# Patient Record
Sex: Female | Born: 1985 | Race: Black or African American | Hispanic: No | Marital: Single | Smoking: Current every day smoker
Health system: Southern US, Community
[De-identification: ages and names within clinical notes are randomized; demographics above are authoritative.]

## PROBLEM LIST (undated history)

## (undated) ENCOUNTER — Emergency Department (HOSPITAL_COMMUNITY): Admission: EM | Payer: Self-pay | Source: Home / Self Care

## (undated) DIAGNOSIS — Z9889 Other specified postprocedural states: Secondary | ICD-10-CM

## (undated) DIAGNOSIS — R112 Nausea with vomiting, unspecified: Secondary | ICD-10-CM

## (undated) DIAGNOSIS — M549 Dorsalgia, unspecified: Secondary | ICD-10-CM

## (undated) HISTORY — PX: TUBAL LIGATION: SHX77

## (undated) HISTORY — PX: KNEE ARTHROSCOPY: SUR90

---
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
## 2006-08-19 ENCOUNTER — Emergency Department (HOSPITAL_COMMUNITY): Admission: EM | Admit: 2006-08-19 | Discharge: 2006-08-19 | Payer: Self-pay | Admitting: Emergency Medicine

## 2009-11-23 ENCOUNTER — Emergency Department (HOSPITAL_COMMUNITY): Admission: EM | Admit: 2009-11-23 | Discharge: 2009-11-23 | Payer: Self-pay | Admitting: Emergency Medicine

## 2010-03-16 ENCOUNTER — Emergency Department (HOSPITAL_COMMUNITY)
Admission: EM | Admit: 2010-03-16 | Discharge: 2010-03-16 | Payer: Self-pay | Source: Home / Self Care | Admitting: Emergency Medicine

## 2010-04-18 ENCOUNTER — Emergency Department (HOSPITAL_COMMUNITY)
Admission: EM | Admit: 2010-04-18 | Discharge: 2010-04-18 | Disposition: A | Discharge status: Home / Self Care | Payer: No Typology Code available for payment source | Attending: Emergency Medicine | Admitting: Emergency Medicine

## 2010-04-18 DIAGNOSIS — F1010 Alcohol abuse, uncomplicated: Secondary | ICD-10-CM | POA: Insufficient documentation

## 2010-04-18 LAB — CBC
MCH: 29 pg (ref 26.0–34.0)
Platelets: 283 10*3/uL (ref 150–400)
RBC: 4.38 MIL/uL (ref 3.87–5.11)

## 2010-04-18 LAB — RAPID URINE DRUG SCREEN, HOSP PERFORMED
Benzodiazepines: NOT DETECTED
Cocaine: POSITIVE — AB
Tetrahydrocannabinol: POSITIVE — AB

## 2010-04-18 LAB — ETHANOL: Alcohol, Ethyl (B): <5 mg/dL (ref 0–10)

## 2010-04-18 LAB — DIFFERENTIAL
Basophils Relative: 0 % (ref 0–1)
Eosinophils Absolute: 0.1 10*3/uL (ref 0.0–0.7)
Monocytes Relative: 8 % (ref 3–12)
Neutrophils Relative %: 51 % (ref 43–77)

## 2010-04-18 LAB — BASIC METABOLIC PANEL
Calcium: 9.5 mg/dL (ref 8.4–10.5)
Chloride: 101 mEq/L (ref 96–112)
Creatinine, Ser: 1.08 mg/dL (ref 0.4–1.2)
GFR calc Af Amer: >60 mL/min (ref >60)
GFR calc non Af Amer: >60 mL/min (ref >60)

## 2010-04-28 ENCOUNTER — Emergency Department (HOSPITAL_COMMUNITY)
Admission: EM | Admit: 2010-04-28 | Discharge: 2010-04-28 | Disposition: A | Discharge status: Home / Self Care | Payer: No Typology Code available for payment source | Attending: Emergency Medicine | Admitting: Emergency Medicine

## 2010-04-28 DIAGNOSIS — G43909 Migraine, unspecified, not intractable, without status migrainosus: Secondary | ICD-10-CM | POA: Insufficient documentation

## 2010-05-18 ENCOUNTER — Emergency Department (HOSPITAL_COMMUNITY)
Admission: EM | Admit: 2010-05-18 | Discharge: 2010-05-18 | Disposition: A | Discharge status: Home / Self Care | Payer: No Typology Code available for payment source | Attending: Emergency Medicine | Admitting: Emergency Medicine

## 2010-05-18 DIAGNOSIS — Z202 Contact with and (suspected) exposure to infections with a predominantly sexual mode of transmission: Secondary | ICD-10-CM | POA: Insufficient documentation

## 2010-05-18 LAB — POCT PREGNANCY, URINE: Preg Test, Ur: NEGATIVE

## 2010-07-10 NOTE — Op Note (Signed)
Susan Greene, Susan Greene                  ACCOUNT NO.:  [id number]   MEDICAL RECORD NO.:  [id number]          PATIENT TYPE:  AMB   LOCATION:  [facility]                           FACILITY:  [facility]   PHYSICIAN:  Vickki Hearing, M.D.DATE OF BIRTH:  [birth year]   DATE OF PROCEDURE:  11/15/2003  DATE OF DISCHARGE:                                 OPERATIVE REPORT   PREOPERATIVE DIAGNOSIS:  Anterior cruciate ligament tear of right knee.  Medial collateral ligament tear of right knee.  Lateral meniscal tear of  right knee.   POSTOPERATIVE DIAGNOSIS:  Anterior cruciate ligament and medial collateral  ligament tear of right knee.   FINDINGS:  Torn anterior cruciate ligament, medial collateral ligament had  healed.  No lateral meniscal tear was seen.   OPERATION PERFORMED:   SURGEON:  Vickki Hearing, M.D.   ASSISTANT:   ANESTHESIA:  General.   INDICATIONS FOR PROCEDURE:  The patient is a 25 year old female who tore her  ACL playing soccer.  She was going to have surgery in [place], but her  insurance was not taken by the physicians there and she presented here about  six months post injury complaining of giving way of the knee on pivoting  maneuver.  She wished to return to an active soccer career.   DESCRIPTION OF PROCEDURE:  She was identified in preop holding.  She was  given Ancef.  Her knee was marked as the surgical site on the right side  over her designation of the surgical site.  I checked her chart and consent  and confirmed this.  We took her to the operating room.  She had general  anesthesia.  She had a prep and drape of her right knee after examination  under anesthesia.  We then took the mandatory time out and everyone agreed  on the procedure, extremity and the patient's identity.   The examination under anesthesia revealed that her Lachman and pivot shift  tests were grade 2.  Her MCL was normal.  We started by taking the graft  with no tourniquet used.  We made an  anterior incision over the patellar  tendon, extended it down to the paratenon, divided it and exposed the  tendon.  We took the middle third of a 29 mm tendon and took a 20 mm bone  block distally and 50% of the length of the patella proximally.  We put the  graft on the back table under tension and then performed diagnostic  arthroscopy of the knee.   All structures were probed and visualized and the findings area as stated.  The ACL was torn.  It was a complete tear.  We did a notchplasty, removed  the ACL stump, passed the tibial guide set at 50 degrees.  We put the ACL  tunnel 7 mm anterior to the PCL.  We overreamed the guide pin with an 11 mm  reamer, cleaned the tibial tunnel and rasped and cleaned its edges.   We put the over-the-top guide over the femoral over-the-top position with a  7 mm offset, drilled a  guide pin through it and then passed a 10 mm reamer  25 mm.  We gave an additional 10 mm for the endo button flipping.  We passed  the endo button drill bit over the pin, measured it to a 52, took a 35 mm  endo button attached to the femoral graft and passed the graft, secured the  endo button by flipping it.  We ensured distal fixation by pulling on the  distal sutures.  We cycled the knee 25 repetitions, noted a nice recession  of the graft on extension. We fixed the graft in extension with a 35 x 11 mm  Bioscrew. We checked the graft for flexion and for Lachman test.  That was  satisfactory.  We re-viewed the graft visually, arthroscopically, checked  tension and position.  That was good.  We irrigated the knee, closed the  patellar tendon defect with #1 interrupted Vicryl sutures.  We bone grafted  the patellar defect, closed with 2-0 Vicryl and 3-0 Biosyn, passed the pain  pump catheter, wrapped the knee sterilely, placed a cryo cuff, extubated her  and took her to recovery room in stable condition.   She will have a regular ACL protocol and follow up with me on  Monday.      SEH/MEDQ  D:  11/15/2003  T:  11/16/2003  Job:  [id number]

## 2010-07-10 NOTE — H&P (Signed)
NAME:  Susan Greene, Susan Greene NO.:  [id number]   MEDICAL RECORD NO.:  [id number]                   PATIENT TYPE:   LOCATION:                                       FACILITY:   PHYSICIAN:  Vickki Hearing, M.D.           DATE OF BIRTH:  [birth year]   DATE OF ADMISSION:  DATE OF DISCHARGE:                                HISTORY & PHYSICAL   CHIEF COMPLAINT:  Right knee anterior cruciate ligament tear.   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  She saw  Dr. Brynda Peon in [place] who recommended surgery after MRI showed that she had  an ACL, MCL, and lateral meniscal tear.  The patient did not have surgery at  that time, went back to playing soccer and wore a brace.  However, in the  brace the patient's knee gives out and goes out place when she cuts or  pivots.  After my evaluation confirmed her ACL tear, she presents for ACL  reconstruction with a bone patella tendon bone autograft.   ALLERGIES:  No known drug allergies.   PAST MEDICAL HISTORY:  No medical problems.   PAST SURGICAL HISTORY:  No previous surgery.   FAMILY HISTORY:  Asthma.   PRIMARY CARE PHYSICIAN:  Dr. Sudie Bailey.   SOCIAL HISTORY:  She does not smoke or drink.   REVIEW OF SYSTEMS:  No other complaints.   PHYSICAL EXAMINATION:  EXTREMITIES:  She has normal pulse and perfusion.  Extremities were warm, no edema, no tenderness.  Lymph nodes were negative.  She had a normal gait pattern.  Had some lateral joint line tenderness, a  positive Lachman's test, a positive pivot-shift test.  She had full range of  motion.  Quadriceps mechanism was intact.  Extensor mechanism was intact.  No chondromalacia of the patella was noted.  SKIN:  No scar, rash, lesions, ulcers.  NEUROLOGIC:  Sensation was normal, reflexes were normal.  She was awake and  alert.  She was not depressed, anxious, or agitated.  GENERAL:  She was thin, normal development.   LABORATORY DATA:  Radiographs show the growth plates to be closed.  MRI  shows she has complete tear of her ACL, a medial collateral ligament sprain  which should be healed at this point, she has a lateral meniscal tear.   DIAGNOSES:  1.  Anterior cruciate ligament tear, right knee.  2.  Lateral meniscal tear.  3.  Medial cruciate ligament tear, right knee.   PLAN:  Anterior cruciate ligament autograft with bone patella tendon bone  construct.  Lateral meniscectomy if needed.  Straight forward anterior  cruciate ligament protocol.     ___________________________________________  Vickki Hearing, M.D.   SEH/MEDQ  D:  11/05/2003  T:  11/05/2003  Job:  [id number]

## 2010-09-27 ENCOUNTER — Emergency Department (HOSPITAL_COMMUNITY)
Admission: EM | Admit: 2010-09-27 | Discharge: 2010-09-27 | Disposition: A | Discharge status: Home / Self Care | Payer: Self-pay | Attending: Emergency Medicine | Admitting: Emergency Medicine

## 2010-09-27 ENCOUNTER — Encounter: Payer: Self-pay | Admitting: *Deleted

## 2010-09-27 DIAGNOSIS — M62838 Other muscle spasm: Secondary | ICD-10-CM | POA: Insufficient documentation

## 2010-09-27 MED ORDER — KETOROLAC TROMETHAMINE 60 MG/2ML IM SOLN
60.0000 mg | Freq: Once | INTRAMUSCULAR | Status: AC
  Administered 2010-09-27: 60 mg via INTRAMUSCULAR
  Filled 2010-09-27: qty 2

## 2010-09-27 MED ORDER — IBUPROFEN 800 MG PO TABS: 800.0000 mg | ORAL_TABLET | Freq: Three times a day (TID) | ORAL | Status: AC

## 2010-09-27 MED ORDER — CYCLOBENZAPRINE HCL 10 MG PO TABS: 10.0000 mg | ORAL_TABLET | Freq: Two times a day (BID) | ORAL | Status: AC | PRN

## 2010-09-27 NOTE — ED Provider Notes (Signed)
History     CSN: [id number] Arrival date & time: 09/27/2010  8:45 PM  Chief Complaint  Patient presents with  . Back Pain  . Arm Swelling   HPI Comments: Patient presents with left upper back pain. has some swelling in her arm.  This has been going on for several months, is constant, it is worse with stretching her arm with palpation of her back. There is no associated numbness or tingling or weakness of the left upper extremity. She does not note any specific injuries. She has had no medications prior to arrival.  Patient is a 25 y.o. female presenting with back pain. The history is provided by the patient and a relative.  Back Pain  Pertinent negatives include no numbness and no weakness.    History reviewed. No pertinent past medical history.  Past Surgical History  Procedure Date  . Knee arthroscopy Right knee    No family history on file.  History  Substance Use Topics  . Smoking status: Not on file  . Smokeless tobacco: Not on file  . Alcohol Use: 14.4 oz/week    24 Cans of beer per week    OB History    Grav Para Term Preterm Abortions TAB SAB Ect Mult Living                  Review of Systems  Musculoskeletal: Positive for back pain.  Skin: Negative for rash.  Neurological: Negative for weakness and numbness.    Physical Exam  BP 124/66  Pulse 84  Temp(Src) 98 F (36.7 C) (Oral)  Resp 20  Ht 5\' 5"  (1.651 m)  Wt 130 lb (58.968 kg)  BMI 21.63 kg/m2  SpO2 100%  Physical Exam  Constitutional: She appears well-developed and well-nourished. No distress.  HENT:  Head: Normocephalic and atraumatic.  Eyes: Conjunctivae are normal. No scleral icterus.  Neck: Normal range of motion. Neck supple.  Cardiovascular: Normal rate, regular rhythm and normal heart sounds.   No murmur heard. Pulmonary/Chest: Effort normal and breath sounds normal.  Musculoskeletal: Normal range of motion. She exhibits tenderness. She exhibits no edema.       Tenderness to  palpation in the left rhomboid, left trapezius muscle. Normal exam of the left upper extremity with normal range of motion  Neurological: She is alert. Coordination normal.  Skin: Skin is warm and dry. No rash noted. She is not diaphoretic.    ED Course  Procedures  MDM Patient has no apparent distress, muscle spasm in the left rhomboid. Left trapezius. Will address this with intramuscular anti-inflammatories and stretching exercises. We have gone over this in the room and she has expressed her understanding and need for followup.      Vida Roller, MD 09/27/10 2114

## 2010-09-27 NOTE — ED Notes (Signed)
Patient discharged home in good condition.  Prescriptions given for Flexeril and Ibuprofen; effects and use explained.  Patient verbalized understanding of effects of medication and to f/u with PMD.

## 2010-09-27 NOTE — ED Notes (Signed)
Pt st's that her lower back and left arm are swelling.  St's that the pain from her back is radiating up to her left arm.  No visible swelling when compared to right arm.

## 2010-09-27 NOTE — Discharge Instructions (Signed)
Ibuprofen 3 times a [place], Flexeril as needed. Stretch her arm in your back constantly throughout the [place] to help improve range of motion and help reduce the pain.

## 2012-02-24 ENCOUNTER — Observation Stay (HOSPITAL_COMMUNITY)
Admission: EM | Admit: 2012-02-24 | Discharge: 2012-02-27 | Disposition: A | Discharge status: Home / Self Care | Payer: Medicaid - Out of State | Attending: General Surgery | Admitting: General Surgery

## 2012-02-24 ENCOUNTER — Encounter (HOSPITAL_COMMUNITY): Payer: Self-pay | Admitting: *Deleted

## 2012-02-24 DIAGNOSIS — K612 Anorectal abscess: Principal | ICD-10-CM | POA: Insufficient documentation

## 2012-02-24 DIAGNOSIS — Z01812 Encounter for preprocedural laboratory examination: Secondary | ICD-10-CM | POA: Insufficient documentation

## 2012-02-24 DIAGNOSIS — K603 Anal fistula, unspecified: Secondary | ICD-10-CM | POA: Insufficient documentation

## 2012-02-24 DIAGNOSIS — K611 Rectal abscess: Secondary | ICD-10-CM

## 2012-02-24 HISTORY — DX: Other specified postprocedural states: R11.2

## 2012-02-24 HISTORY — DX: Other specified postprocedural states: Z98.890

## 2012-02-24 HISTORY — DX: Nausea with vomiting, unspecified: Z98.890

## 2012-02-24 LAB — CBC WITH DIFFERENTIAL/PLATELET
Basophils Absolute: 0 10*3/uL (ref 0.0–0.1)
Basophils Relative: 0 % (ref 0–1)
Hemoglobin: 11.5 g/dL — ABNORMAL LOW (ref 12.0–15.0)
MCHC: 34.7 g/dL (ref 30.0–36.0)
Neutro Abs: 16.5 10*3/uL — ABNORMAL HIGH (ref 1.7–7.7)
Neutrophils Relative %: 87 % — ABNORMAL HIGH (ref 43–77)
Platelets: 340 10*3/uL (ref 150–400)
RDW: 14.8 % (ref 11.5–15.5)

## 2012-02-24 LAB — BASIC METABOLIC PANEL
Calcium: 8.8 mg/dL (ref 8.4–10.5)
Chloride: 93 mEq/L — ABNORMAL LOW (ref 96–112)
GFR calc Af Amer: >90 mL/min (ref >90)
GFR calc non Af Amer: >90 mL/min (ref >90)
GFR calc non Af Amer: >90 mL/min (ref >90)
Potassium: 2.7 mEq/L — CL (ref 3.5–5.1)
Potassium: 3.2 mEq/L — ABNORMAL LOW (ref 3.5–5.1)
Sodium: 134 mEq/L — ABNORMAL LOW (ref 135–145)
Sodium: 133 mEq/L — ABNORMAL LOW (ref 135–145)

## 2012-02-24 LAB — HCG, SERUM, QUALITATIVE: Preg, Serum: NEGATIVE

## 2012-02-24 MED ORDER — HYDROMORPHONE HCL PF 1 MG/ML IJ SOLN
1.0000 mg | INTRAMUSCULAR | Status: DC | PRN
  Administered 2012-02-24 – 2012-02-25 (×5): 1 mg via INTRAVENOUS
  Filled 2012-02-24 (×5): qty 1

## 2012-02-24 MED ORDER — METRONIDAZOLE IN NACL 5-0.79 MG/ML-% IV SOLN
500.0000 mg | Freq: Three times a day (TID) | INTRAVENOUS | Status: DC
  Administered 2012-02-24 – 2012-02-25 (×2): 500 mg via INTRAVENOUS
  Filled 2012-02-24 (×12): qty 100

## 2012-02-24 MED ORDER — POTASSIUM CHLORIDE 10 MEQ/100ML IV SOLN
10.0000 meq | INTRAVENOUS | Status: AC
  Administered 2012-02-24 (×5): 10 meq via INTRAVENOUS
  Filled 2012-02-24: qty 100
  Filled 2012-02-24: qty 400

## 2012-02-24 MED ORDER — CHLORHEXIDINE GLUCONATE 4 % EX LIQD: 1.0000 "application " | Freq: Once | CUTANEOUS | Status: DC

## 2012-02-24 MED ORDER — ACETAMINOPHEN 325 MG PO TABS: 650.0000 mg | ORAL_TABLET | Freq: Four times a day (QID) | ORAL | Status: DC | PRN

## 2012-02-24 MED ORDER — CIPROFLOXACIN IN D5W 400 MG/200ML IV SOLN
400.0000 mg | Freq: Two times a day (BID) | INTRAVENOUS | Status: DC
  Administered 2012-02-24 – 2012-02-25 (×2): 400 mg via INTRAVENOUS
  Filled 2012-02-24 (×8): qty 200

## 2012-02-24 MED ORDER — DIPHENHYDRAMINE HCL 12.5 MG/5ML PO ELIX
12.5000 mg | ORAL_SOLUTION | Freq: Four times a day (QID) | ORAL | Status: DC | PRN
  Administered 2012-02-25: 12.5 mg via ORAL
  Filled 2012-02-24: qty 5

## 2012-02-24 MED ORDER — DIPHENHYDRAMINE HCL 50 MG/ML IJ SOLN: 12.5000 mg | Freq: Four times a day (QID) | INTRAMUSCULAR | Status: DC | PRN

## 2012-02-24 MED ORDER — ACETAMINOPHEN 650 MG RE SUPP: 650.0000 mg | Freq: Four times a day (QID) | RECTAL | Status: DC | PRN

## 2012-02-24 MED ORDER — SODIUM CHLORIDE 0.9 % IV BOLUS (SEPSIS)
1000.0000 mL | Freq: Once | INTRAVENOUS | Status: AC
  Administered 2012-02-24: 1000 mL via INTRAVENOUS

## 2012-02-24 MED ORDER — KCL IN DEXTROSE-NACL 40-5-0.45 MEQ/L-%-% IV SOLN
INTRAVENOUS | Status: DC
  Administered 2012-02-24: 23:00:00 via INTRAVENOUS

## 2012-02-24 MED ORDER — HYDROCODONE-ACETAMINOPHEN 5-325 MG PO TABS
1.0000 | ORAL_TABLET | ORAL | Status: DC | PRN
  Administered 2012-02-24 (×2): 1 via ORAL
  Filled 2012-02-24 (×2): qty 1

## 2012-02-24 MED ORDER — ONDANSETRON HCL 4 MG/2ML IJ SOLN
4.0000 mg | Freq: Four times a day (QID) | INTRAMUSCULAR | Status: DC | PRN
  Administered 2012-02-24: 4 mg via INTRAVENOUS
  Filled 2012-02-24: qty 2

## 2012-02-24 MED ORDER — ENOXAPARIN SODIUM 40 MG/0.4ML ~~LOC~~ SOLN
40.0000 mg | SUBCUTANEOUS | Status: DC
  Filled 2012-02-24: qty 0.4

## 2012-02-24 NOTE — ED Provider Notes (Signed)
History    This chart was scribed for Benny Lennert, MD, MD by Smitty Pluck, ED Scribe. The patient was seen in room [facility]07 and the patient's care was started at 2:13 PM.   CSN: [id number]  Arrival date & time 02/24/12  1244   None     Chief Complaint  Patient presents with  . Rectal Pain    (Consider location/radiation/quality/duration/timing/severity/associated sxs/prior treatment) The history is provided by the patient. No language interpreter was used.   Susan Greene is a 27 y.o. female who presents to the Emergency Department complaining of constant, moderate rectal pain. Pt reports that she was given cream at [facility] but states she has not had relief of pain. She reports swelling and inflammation of her rectum. She denies any other pain or symptoms.  History reviewed. No pertinent past medical history.  Past Surgical History  Procedure Date  . Knee arthroscopy Right knee    History reviewed. No pertinent family history.  History  Substance Use Topics  . Smoking status: Current Every Antonopoulos Smoker -- 0.2 packs/[place]    Types: Cigarettes  . Smokeless tobacco: Not on file  . Alcohol Use: 14.4 oz/week    24 Cans of beer per week    OB History    Grav Para Term Preterm Abortions TAB SAB Ect Mult Living                  Review of Systems  Constitutional: Negative for fatigue.  HENT: Negative for congestion, sinus pressure and ear discharge.   Eyes: Negative for discharge.  Respiratory: Negative for cough.   Cardiovascular: Negative for chest pain.  Gastrointestinal: Positive for rectal pain. Negative for abdominal pain and diarrhea.  Genitourinary: Negative for frequency and hematuria.  Musculoskeletal: Negative for back pain.  Skin: Negative for rash.  Neurological: Negative for seizures and headaches.  Hematological: Negative.   Psychiatric/Behavioral: Negative for hallucinations.  All other systems reviewed and are negative.    Allergies  Review  of patient's allergies indicates no known allergies.  Home Medications   Current Outpatient Rx  Name  Route  Sig  Dispense  Refill  . ACETAMINOPHEN 500 MG PO TABS   Oral   Take 1,000 mg by mouth every 6 (six) hours as needed. pain          . IBUPROFEN 200 MG PO TABS   Oral   Take 800 mg by mouth every 6 (six) hours as needed. pain            BP 142/88  Pulse 147  Temp 98.5 F (36.9 C) (Oral)  Resp 20  Ht 5\' 5"  (1.651 m)  Wt 130 lb (58.968 kg)  BMI 21.63 kg/m2  LMP 01/24/2012  Physical Exam  Nursing note and vitals reviewed. Constitutional: She is oriented to person, place, and time. She appears well-developed.  HENT:  Head: Normocephalic and atraumatic.  Eyes: Conjunctivae normal and EOM are normal. No scleral icterus.  Neck: Neck supple. No thyromegaly present.  Cardiovascular: Normal rate and regular rhythm.  Exam reveals no gallop and no friction rub.   No murmur heard. Pulmonary/Chest: No stridor. She has no wheezes. She has no rales. She exhibits no tenderness.  Abdominal: She exhibits no distension. There is no tenderness. There is no rebound.  Genitourinary:       Perirectal abscess superior to anus 3 cm in diameter  Musculoskeletal: Normal range of motion. She exhibits no edema.  Lymphadenopathy:    She  has no cervical adenopathy.  Neurological: She is oriented to person, place, and time. Coordination normal.  Skin: No rash noted. No erythema.  Psychiatric: She has a normal mood and affect. Her behavior is normal.    ED Course  Procedures (including critical care time)   COORDINATION OF CARE: 2:18 PM Discussed ED treatment with pt  2:44 PM Consult: Dr. Lovell Sheehan (surgeon) concerning pt care.    Labs Reviewed - No data to display No results found.   No diagnosis found.    MDM   The chart was scribed for me under my direct supervision.  I personally performed the history, physical, and medical decision making and all procedures in the  evaluation of this patient.Benny Lennert, MD 02/26/12 [phone]

## 2012-02-24 NOTE — ED Notes (Signed)
Rectal pain, seen at [facility] and given a creme to use, says no better.  Decreased appetite, but says she has been drinking flds.

## 2012-02-24 NOTE — ED Notes (Signed)
Dr. Lovell Sheehan here to assess for Admission

## 2012-02-24 NOTE — H&P (Signed)
Susan Greene is an 27 y.o. female.   Chief Complaint: Rectal pain HPI: Patient is a 27 year old black female who went to the emergency room in [place] 2 days ago for perirectal pain. She was discharged home on hemorrhoidal cream. She presented back to the emergency room here at [facility] for further evaluation treatment. She states her rectal pain has worsened. She feels nauseated and has had decreased appetite for the past 3-4 days. Evaluation in the emergency room revealed a large perirectal abscess.  History reviewed. No pertinent past medical history.  Past Surgical History  Procedure Date  . Knee arthroscopy Right knee    History reviewed. No pertinent family history. Social History:  reports that she has been smoking Cigarettes.  She has been smoking about .25 packs per [place]. She does not have any smokeless tobacco history on file. She reports that she drinks about 14.4 ounces of alcohol per week. She reports that she does not use illicit drugs.  Allergies: No Known Allergies   (Not in a hospital admission)  Results for orders placed during the hospital encounter of 02/24/12 (from the past 48 hour(s))  CBC WITH DIFFERENTIAL     Status: Abnormal   Collection Time   02/24/12  2:19 PM      Component Value Range Comment   WBC 18.9 (*) 4.0 - 10.5 K/uL    RBC 4.01  3.87 - 5.11 MIL/uL    Hemoglobin 11.5 (*) 12.0 - 15.0 g/dL    HCT 16.1 (*) 09.6 - 46.0 %    MCV 82.5  78.0 - 100.0 fL    MCH 28.7  26.0 - 34.0 pg    MCHC 34.7  30.0 - 36.0 g/dL    RDW 04.5  40.9 - 81.1 %    Platelets 340  150 - 400 K/uL    Neutrophils Relative 87 (*) 43 - 77 %    Neutro Abs 16.5 (*) 1.7 - 7.7 K/uL    Lymphocytes Relative 5 (*) 12 - 46 %    Lymphs Abs 0.8  0.7 - 4.0 K/uL    Monocytes Relative 8  3 - 12 %    Monocytes Absolute 1.5 (*) 0.1 - 1.0 K/uL    Eosinophils Relative 0  0 - 5 %    Eosinophils Absolute 0.0  0.0 - 0.7 K/uL    Basophils Relative 0  0 - 1 %    Basophils Absolute 0.0  0.0 - 0.1 K/uL     No results found.  Review of Systems  Constitutional: Positive for fever and malaise/fatigue.  HENT: Negative.   Eyes: Negative.   Respiratory: Negative.   Cardiovascular: Negative.   Gastrointestinal: Positive for nausea.  Genitourinary: Negative.   Musculoskeletal: Negative.   Skin: Negative.   Neurological: Negative.   Endo/Heme/Allergies: Negative.   Psychiatric/Behavioral: Negative.     Blood pressure 116/51, pulse 103, temperature 98.5 F (36.9 C), temperature source Oral, resp. rate 20, height 5\' 5"  (1.651 m), weight 58.968 kg (130 lb), last menstrual period 01/24/2012, SpO2 100.00%. Physical Exam  Constitutional: She is oriented to person, place, and time. She appears well-developed and well-nourished.  HENT:  Head: Normocephalic and atraumatic.  Neck: Normal range of motion. Neck supple.  Cardiovascular: Normal rate, regular rhythm and normal heart sounds.   Respiratory: Effort normal and breath sounds normal.  GI: Soft. Bowel sounds are normal.  Genitourinary:       Large posterior perirectal abscess present.  No drainage noted.  Neurological: She is alert and oriented to person, place, and time.  Skin: Skin is warm and dry.  Psychiatric: She has a normal mood and affect. Her behavior is normal. Thought content normal.     Assessment/Plan Impression: Perirectal abscess Plan: The patient be admitted to the hospital for IV antibiotics. She was subsequently undergo incision and drainage of the perirectal abscess tomorrow. The risks and benefits of the procedure were fully explained to the patient, who gave informed consent.  [name] A 02/24/2012, 3:09 PM

## 2012-02-24 NOTE — ED Notes (Signed)
CRITICAL VALUE ALERT  Critical value received:  K+ 2.7  Date of notification:  02/24/12  Time of notification:  1527  Critical value read back: yes  Nurse who received alert:  A. Dareen Piano, RN  MD notified (1st page):  Dr. Lovell Sheehan  Time of first page:  [phone]

## 2012-02-25 ENCOUNTER — Observation Stay (HOSPITAL_COMMUNITY): Payer: Medicaid - Out of State | Admitting: Anesthesiology

## 2012-02-25 ENCOUNTER — Encounter (HOSPITAL_COMMUNITY): Payer: Self-pay | Admitting: Anesthesiology

## 2012-02-25 ENCOUNTER — Encounter (HOSPITAL_COMMUNITY): Payer: Self-pay | Admitting: *Deleted

## 2012-02-25 ENCOUNTER — Encounter (HOSPITAL_COMMUNITY)
Admission: EM | Disposition: A | Discharge status: Home / Self Care | Payer: Self-pay | Source: Home / Self Care | Attending: Emergency Medicine

## 2012-02-25 HISTORY — PX: INCISION AND DRAINAGE PERIRECTAL ABSCESS: SHX1804

## 2012-02-25 LAB — MRSA PCR SCREENING: MRSA by PCR: NEGATIVE

## 2012-02-25 SURGERY — INCISION AND DRAINAGE, ABSCESS, PERIRECTAL
Anesthesia: General | Site: Rectum | Wound class: Dirty or Infected

## 2012-02-25 MED ORDER — CIPROFLOXACIN IN D5W 400 MG/200ML IV SOLN
INTRAVENOUS | Status: DC | PRN
  Administered 2012-02-25: 400 mg via INTRAVENOUS

## 2012-02-25 MED ORDER — ONDANSETRON HCL 4 MG/2ML IJ SOLN: 4.0000 mg | Freq: Four times a day (QID) | INTRAMUSCULAR | Status: DC | PRN

## 2012-02-25 MED ORDER — FENTANYL CITRATE 0.05 MG/ML IJ SOLN
INTRAMUSCULAR | Status: DC | PRN
  Administered 2012-02-25 (×2): 25 ug via INTRAVENOUS
  Administered 2012-02-25: 100 ug via INTRAVENOUS
  Administered 2012-02-25 (×2): 25 ug via INTRAVENOUS

## 2012-02-25 MED ORDER — FENTANYL CITRATE 0.05 MG/ML IJ SOLN
INTRAMUSCULAR | Status: AC
  Filled 2012-02-25: qty 2

## 2012-02-25 MED ORDER — ACETAMINOPHEN 10 MG/ML IV SOLN
1000.0000 mg | Freq: Four times a day (QID) | INTRAVENOUS | Status: DC
  Administered 2012-02-25: 1000 mg via INTRAVENOUS
  Filled 2012-02-25 (×5): qty 100

## 2012-02-25 MED ORDER — PROPOFOL 10 MG/ML IV EMUL
INTRAVENOUS | Status: DC | PRN
  Administered 2012-02-25: 50 mg via INTRAVENOUS
  Administered 2012-02-25: 200 mg via INTRAVENOUS

## 2012-02-25 MED ORDER — GLYCOPYRROLATE 0.2 MG/ML IJ SOLN
0.2000 mg | Freq: Once | INTRAMUSCULAR | Status: AC
  Administered 2012-02-25: 0.2 mg via INTRAVENOUS

## 2012-02-25 MED ORDER — MIDAZOLAM HCL 2 MG/2ML IJ SOLN
1.0000 mg | INTRAMUSCULAR | Status: DC | PRN
  Administered 2012-02-25: 2 mg via INTRAVENOUS

## 2012-02-25 MED ORDER — CHLORHEXIDINE GLUCONATE 4 % EX LIQD
1.0000 "application " | Freq: Once | CUTANEOUS | Status: AC
  Administered 2012-02-25: 1 via TOPICAL
  Filled 2012-02-25: qty 15

## 2012-02-25 MED ORDER — BUPIVACAINE HCL (PF) 0.5 % IJ SOLN
INTRAMUSCULAR | Status: AC
  Filled 2012-02-25: qty 30

## 2012-02-25 MED ORDER — DIPHENHYDRAMINE HCL 12.5 MG/5ML PO ELIX: 12.5000 mg | ORAL_SOLUTION | Freq: Four times a day (QID) | ORAL | Status: DC | PRN

## 2012-02-25 MED ORDER — SODIUM CHLORIDE 0.9 % IJ SOLN
INTRAMUSCULAR | Status: AC
  Administered 2012-02-25: 3 mL
  Filled 2012-02-25: qty 3

## 2012-02-25 MED ORDER — LACTATED RINGERS IV SOLN
INTRAVENOUS | Status: DC
  Administered 2012-02-25 – 2012-02-26 (×2): via INTRAVENOUS

## 2012-02-25 MED ORDER — GLYCOPYRROLATE 0.2 MG/ML IJ SOLN
INTRAMUSCULAR | Status: AC
  Filled 2012-02-25: qty 1

## 2012-02-25 MED ORDER — LIDOCAINE HCL (CARDIAC) 10 MG/ML IV SOLN
INTRAVENOUS | Status: DC | PRN
  Administered 2012-02-25: 50 mg via INTRAVENOUS

## 2012-02-25 MED ORDER — SODIUM CHLORIDE 0.9 % IJ SOLN
INTRAMUSCULAR | Status: AC
  Administered 2012-02-25: 12:00:00
  Filled 2012-02-25: qty 3

## 2012-02-25 MED ORDER — ONDANSETRON HCL 4 MG/2ML IJ SOLN
INTRAMUSCULAR | Status: AC
  Filled 2012-02-25: qty 2

## 2012-02-25 MED ORDER — ENOXAPARIN SODIUM 40 MG/0.4ML ~~LOC~~ SOLN
40.0000 mg | SUBCUTANEOUS | Status: DC
  Administered 2012-02-26: 40 mg via SUBCUTANEOUS
  Filled 2012-02-25: qty 0.4

## 2012-02-25 MED ORDER — DIPHENHYDRAMINE HCL 12.5 MG/5ML PO ELIX
12.5000 mg | ORAL_SOLUTION | Freq: Four times a day (QID) | ORAL | Status: DC | PRN
  Administered 2012-02-25: 12.5 mg via ORAL
  Filled 2012-02-25: qty 5

## 2012-02-25 MED ORDER — PROPOFOL 10 MG/ML IV EMUL
INTRAVENOUS | Status: AC
  Filled 2012-02-25: qty 20

## 2012-02-25 MED ORDER — ARTIFICIAL TEARS OP OINT
TOPICAL_OINTMENT | OPHTHALMIC | Status: AC
  Filled 2012-02-25: qty 3.5

## 2012-02-25 MED ORDER — HYDROCODONE-ACETAMINOPHEN 5-325 MG PO TABS
1.0000 | ORAL_TABLET | ORAL | Status: DC | PRN
  Administered 2012-02-25: 1 via ORAL
  Administered 2012-02-25 – 2012-02-27 (×7): 2 via ORAL
  Filled 2012-02-25 (×6): qty 2
  Filled 2012-02-25: qty 1
  Filled 2012-02-25: qty 2

## 2012-02-25 MED ORDER — HYDROMORPHONE HCL PF 1 MG/ML IJ SOLN
1.0000 mg | INTRAMUSCULAR | Status: DC | PRN
  Administered 2012-02-25 (×3): 1 mg via INTRAVENOUS
  Filled 2012-02-25 (×3): qty 1

## 2012-02-25 MED ORDER — LACTATED RINGERS IV SOLN
INTRAVENOUS | Status: DC | PRN
  Administered 2012-02-25 (×2): via INTRAVENOUS

## 2012-02-25 MED ORDER — BUPIVACAINE HCL (PF) 0.5 % IJ SOLN
INTRAMUSCULAR | Status: DC | PRN
  Administered 2012-02-25: 5 mL

## 2012-02-25 MED ORDER — CIPROFLOXACIN IN D5W 400 MG/200ML IV SOLN
400.0000 mg | Freq: Two times a day (BID) | INTRAVENOUS | Status: DC
  Administered 2012-02-25 – 2012-02-26 (×3): 400 mg via INTRAVENOUS
  Filled 2012-02-25 (×6): qty 200

## 2012-02-25 MED ORDER — METRONIDAZOLE IN NACL 5-0.79 MG/ML-% IV SOLN
500.0000 mg | Freq: Three times a day (TID) | INTRAVENOUS | Status: DC
  Administered 2012-02-25 – 2012-02-27 (×6): 500 mg via INTRAVENOUS
  Filled 2012-02-25 (×10): qty 100

## 2012-02-25 MED ORDER — ONDANSETRON HCL 4 MG PO TABS: 4.0000 mg | ORAL_TABLET | Freq: Four times a day (QID) | ORAL | Status: DC | PRN

## 2012-02-25 MED ORDER — SODIUM CHLORIDE 0.9 % IR SOLN
Status: DC | PRN
  Administered 2012-02-25: 1000 mL

## 2012-02-25 MED ORDER — LACTATED RINGERS IV SOLN: INTRAVENOUS | Status: DC

## 2012-02-25 MED ORDER — LIDOCAINE HCL (PF) 1 % IJ SOLN
INTRAMUSCULAR | Status: AC
  Filled 2012-02-25: qty 5

## 2012-02-25 MED ORDER — MIDAZOLAM HCL 2 MG/2ML IJ SOLN
INTRAMUSCULAR | Status: AC
  Filled 2012-02-25: qty 2

## 2012-02-25 MED ORDER — ONDANSETRON HCL 4 MG/2ML IJ SOLN
4.0000 mg | Freq: Once | INTRAMUSCULAR | Status: AC
  Administered 2012-02-25: 4 mg via INTRAVENOUS

## 2012-02-25 SURGICAL SUPPLY — 29 items
BAG HAMPER (MISCELLANEOUS) ×3 IMPLANT
BANDAGE CONFORM 2  STR LF (GAUZE/BANDAGES/DRESSINGS) IMPLANT
CLOTH BEACON ORANGE TIMEOUT ST (SAFETY) ×3 IMPLANT
COVER LIGHT HANDLE STERIS (MISCELLANEOUS) ×6 IMPLANT
ELECT REM PT RETURN 9FT ADLT (ELECTROSURGICAL) ×3
ELECTRODE REM PT RTRN 9FT ADLT (ELECTROSURGICAL) ×2 IMPLANT
GAUZE PACKING IODOFORM 1 (PACKING) ×3 IMPLANT
GLOVE BIOGEL M STRL SZ7.5 (GLOVE) ×3 IMPLANT
GLOVE EXAM NITRILE MD LF STRL (GLOVE) ×6 IMPLANT
GLOVE SS BIOGEL STRL SZ 6.5 (GLOVE) ×2 IMPLANT
GLOVE SUPERSENSE BIOGEL SZ 6.5 (GLOVE) ×1
GOWN STRL REIN XL XLG (GOWN DISPOSABLE) ×6 IMPLANT
KIT ROOM TURNOVER APOR (KITS) ×3 IMPLANT
MANIFOLD NEPTUNE II (INSTRUMENTS) ×3 IMPLANT
MARKER SKIN DUAL TIP RULER LAB (MISCELLANEOUS) ×3 IMPLANT
NEEDLE HYPO 25X1 1.5 SAFETY (NEEDLE) ×3 IMPLANT
NS IRRIG 1000ML POUR BTL (IV SOLUTION) ×3 IMPLANT
PACK BASIC LIMB (CUSTOM PROCEDURE TRAY) IMPLANT
PACK MINOR (CUSTOM PROCEDURE TRAY) IMPLANT
PACK PERI GYN (CUSTOM PROCEDURE TRAY) ×3 IMPLANT
PAD ABD 5X9 TENDERSORB (GAUZE/BANDAGES/DRESSINGS) IMPLANT
PAD ARMBOARD 7.5X6 YLW CONV (MISCELLANEOUS) ×3 IMPLANT
SET BASIN LINEN APH (SET/KITS/TRAYS/PACK) ×3 IMPLANT
SPONGE GAUZE 4X4 12PLY (GAUZE/BANDAGES/DRESSINGS) IMPLANT
SUT CHROMIC 2 0 SH (SUTURE) ×6 IMPLANT
SWAB CULTURE LIQ STUART DBL (MISCELLANEOUS) ×3 IMPLANT
SYR BULB IRRIGATION 50ML (SYRINGE) ×3 IMPLANT
SYR CONTROL 10ML LL (SYRINGE) ×6 IMPLANT
TUBE ANAEROBIC PORT A CUL  W/M (MISCELLANEOUS) ×3 IMPLANT

## 2012-02-25 NOTE — Transfer of Care (Signed)
  Anesthesia Post-op Note  Patient: Susan Greene  Procedure(s) Performed: Procedure(s) (LRB) with comments: IRRIGATION AND DEBRIDEMENT PERIRECTAL ABSCESS (N/A) - Fistulotomy  Patient Location: PACU  Anesthesia Type: General  Level of Consciousness: awake, alert , oriented and patient cooperative  Airway and Oxygen Therapy: Patient Spontanous Breathing oxygen via nasal cannula  Post-op Pain: mild  Post-op Assessment: Post-op Vital signs reviewed, Patient's Cardiovascular Status Stable, Respiratory Function Stable, Patent Airway and No signs of Nausea or vomiting  Post-op Vital Signs: Reviewed and stable  Complications: No apparent anesthesia complications

## 2012-02-25 NOTE — Anesthesia Preprocedure Evaluation (Signed)
Anesthesia Evaluation  Patient identified by MRN, date of birth, ID band Patient awake    Reviewed: Allergy & Precautions, H&P , NPO status , Patient's Chart, lab work & pertinent test results  History of Anesthesia Complications (+) PONV  Airway Mallampati: I TM Distance: >3 FB Neck ROM: Full    Dental No notable dental hx. (+) Teeth Intact   Pulmonary neg pulmonary ROS, Current Smoker,    Pulmonary exam normal       Cardiovascular negative cardio ROS  Rhythm:Regular Rate:Normal     Neuro/Psych negative neurological ROS  negative psych ROS   GI/Hepatic negative GI ROS, Neg liver ROS,   Endo/Other  negative endocrine ROS  Renal/GU negative Renal ROS     Musculoskeletal negative musculoskeletal ROS (+)   Abdominal Normal abdominal exam  (+)   Peds  Hematology  (+) Blood dyscrasia, anemia ,   Anesthesia Other Findings   Reproductive/Obstetrics negative OB ROS                           Anesthesia Physical Anesthesia Plan  ASA: I  Anesthesia Plan: General   Post-op Pain Management:    Induction: Intravenous  Airway Management Planned: LMA  Additional Equipment:   Intra-op Plan:   Post-operative Plan: Extubation in OR  Informed Consent: I have reviewed the patients History and Physical, chart, labs and discussed the procedure including the risks, benefits and alternatives for the proposed anesthesia with the patient or authorized representative who has indicated his/her understanding and acceptance.     Plan Discussed with: CRNA  Anesthesia Plan Comments:         Anesthesia Quick Evaluation

## 2012-02-25 NOTE — Progress Notes (Signed)
UR Chart Review Completed  

## 2012-02-25 NOTE — Progress Notes (Deleted)
02/25/12 0827 Patient to have possible stress test today per report, night shift nurse stated had discussed with patient to remain NPO until finding out for sure if test to be done. On rounds this morning, night shift nurse reminded patient not to eat breakfast, patient stated okay. NPO order placed and sign placed outside room this morning, on assessment patient found to have eaten breakfast tray. Stated "what are they planning today, i've heard about those stress tests, not sure I can do that". Notified Dr Sherrie Mustache of patient having eaten breakfast. Will discuss with cardiology. Earnstine Regal, RN

## 2012-02-25 NOTE — Plan of Care (Signed)
Problem: Phase II Progression Outcomes Goal: Progress activity as tolerated unless otherwise ordered Outcome: Completed/Met Date Met:  02/25/12 02/25/12 1416 Patient up with assist/supervision in room, ambulates to bathroom with nursing staff supervision. bedalarm on for safety, instructed to call for assist when needing to get out of bed. States understands and will call.

## 2012-02-25 NOTE — Op Note (Signed)
Patient:  Susan Greene  DOB:  1985/12/31  MRN:  [id number]   Preop Diagnosis:  Perirectal abscess  Postop Diagnosis:  Same, rectal fistula  Procedure:  Incision and drainage of perirectal abscess, submuscularis, fistulotomy  Surgeon:  Franky Macho, M.D.  Anes:  General  Indications:  Patient is a 27 year old black female who presented to the emergency room yesterday with worsening perirectal pain. She was noted to have fluctuance along the posterior aspect of the perianal region. She now comes the operating room for incision and drainage of the perirectal abscess. The risks and benefits of the procedure were fully explained to the patient, who gave informed consent.  Procedure note:  The patient was placed in the lithotomy position after general anesthesia was administered. The perineum was prepped and draped using usual sterile technique with Betadine. Surgical site confirmation was performed. Purulent fluid was noted to be emanating from the rectum.  An incision was made into the fluctuant area which was noted approximately the 6:00 position. A large amount of pus was present. Aerobic and anaerobic cultures were taken and sent to microbiology. The abscess cavity extended along the posterior aspect of the rectum. Using a probe, the patient was noted to have a fistula from the dentate line posteriorly directly into the abscess cavity. This was unroofed until I reached the external sphincter muscle. The abscess cavity was completed. Given the exposure of the external sphincter muscle, this was not divided and was felt that the patient would best be served by packing the wound. The rectal mucosa was reapproximated using 2-0 chromic gut interrupted sutures. 0.5% Sensorcaine was instilled the surrounding wound. The abscess cavity was packed with iodoform new gauze. A dressing was applied.  All tape and needle counts were correct at the end of the procedure. The patient was awakened and transferred  to PACU in stable condition.  Complications:  None  EBL:  Minimal  Specimen:  Aerobic and anaerobic cultures of perirectal abscess

## 2012-02-25 NOTE — Anesthesia Procedure Notes (Signed)
Procedure Name: LMA Insertion Date/Time: 02/25/2012 10:14 AM Performed by: Carolyne Littles, AMY L Pre-anesthesia Checklist: Patient identified, Patient being monitored, Emergency Drugs available, Timeout performed and Suction available Patient Re-evaluated:Patient Re-evaluated prior to inductionOxygen Delivery Method: Circle system utilized Preoxygenation: Pre-oxygenation with 100% oxygen Intubation Type: IV induction Ventilation: Mask ventilation without difficulty LMA: LMA inserted LMA Size: 4.0 Number of attempts: 1 Placement Confirmation: positive ETCO2 and breath sounds checked- equal and bilateral Tube secured with: Tape Dental Injury: Teeth and Oropharynx as per pre-operative assessment

## 2012-02-25 NOTE — Anesthesia Postprocedure Evaluation (Addendum)
  Anesthesia Post-op Note  Patient: Susan Greene  Procedure(s) Performed: Procedure(s) (LRB) with comments: IRRIGATION AND DEBRIDEMENT PERIRECTAL ABSCESS (N/A) - Fistulotomy  Patient Location: PACU  Anesthesia Type: General  Level of Consciousness: awake, alert , oriented and patient cooperative  Airway and Oxygen Therapy: Patient Spontanous Breathing oxygen via nasal cannula  Post-op Pain: mild  Post-op Assessment: Post-op Vital signs reviewed, Patient's Cardiovascular Status Stable, Respiratory Function Stable, Patent Airway and No signs of Nausea or vomiting  Post-op Vital Signs: Reviewed and stable  Complications: No apparent anesthesia complications   02/27/12  Patient discharged yesterday.

## 2012-02-25 NOTE — Preoperative (Signed)
Beta Blockers   Reason not to administer Beta Blockers:Not Applicable 

## 2012-02-25 NOTE — Care Management Note (Signed)
    Page 1 of 1   02/25/2012     2:56:13 PM   CARE MANAGEMENT NOTE 02/25/2012  Patient:  Susan Greene,Susan Greene   Account Number:  [id number]  Date Initiated:  02/25/2012  Documentation initiated by:  Sharrie Rothman  Subjective/Objective Assessment:   Pt admitted from home with perirectal abcess. Pt lives with her children and will return home at discharge. Pt is independent with ADL's.     Action/Plan:   Financial counselor notified of pts self pay status. Pt stated that she would obtain funds for medication from family. No other CM or HH needs noted. Pt is potential discharge for 02/26/12.   Anticipated DC Date:  02/26/2012   Anticipated DC Plan:  HOME/SELF CARE  In-house referral  Financial Counselor      DC Planning Services  CM consult      Choice offered to / List presented to:             Status of service:  Completed, signed off Medicare Important Message given?   (If response is "NO", the following Medicare IM given date fields will be blank) Date Medicare IM given:   Date Additional Medicare IM given:    Discharge Disposition:  HOME/SELF CARE  Per UR Regulation:    If discussed at Long Length of Stay Meetings, dates discussed:    Comments:

## 2012-02-26 LAB — CBC
HCT: 27.9 % — ABNORMAL LOW (ref 36.0–46.0)
MCH: 28.7 pg (ref 26.0–34.0)
MCHC: 34.4 g/dL (ref 30.0–36.0)
MCV: 83.3 fL (ref 78.0–100.0)
Platelets: 305 10*3/uL (ref 150–400)
RDW: 15.8 % — ABNORMAL HIGH (ref 11.5–15.5)
WBC: 12.7 10*3/uL — ABNORMAL HIGH (ref 4.0–10.5)

## 2012-02-26 LAB — BASIC METABOLIC PANEL
BUN: 6 mg/dL (ref 6–23)
Calcium: 8.9 mg/dL (ref 8.4–10.5)
Chloride: 98 mEq/L (ref 96–112)
Creatinine, Ser: 0.65 mg/dL (ref 0.50–1.10)
GFR calc Af Amer: >90 mL/min (ref >90)

## 2012-02-26 MED ORDER — HYDROGEN PEROXIDE 3 % EX SOLN
CUTANEOUS | Status: AC
  Administered 2012-02-26: 17:00:00
  Filled 2012-02-26: qty 480

## 2012-02-26 MED ORDER — FENTANYL CITRATE 0.05 MG/ML IJ SOLN
50.0000 ug | INTRAMUSCULAR | Status: DC | PRN
  Administered 2012-02-26 – 2012-02-27 (×3): 50 ug via INTRAVENOUS
  Filled 2012-02-26 (×3): qty 2

## 2012-02-26 MED ORDER — HYDROGEN PEROXIDE 3 % EX SOLN: CUTANEOUS | Status: DC | PRN

## 2012-02-26 MED ORDER — POTASSIUM CHLORIDE 10 MEQ/100ML IV SOLN
10.0000 meq | INTRAVENOUS | Status: AC
  Administered 2012-02-26 (×4): 10 meq via INTRAVENOUS
  Filled 2012-02-26 (×3): qty 100

## 2012-02-26 MED ORDER — KCL IN DEXTROSE-NACL 40-5-0.45 MEQ/L-%-% IV SOLN
INTRAVENOUS | Status: DC
  Administered 2012-02-26: 10:00:00 via INTRAVENOUS

## 2012-02-26 MED ORDER — POTASSIUM CHLORIDE 10 MEQ/100ML IV SOLN
INTRAVENOUS | Status: AC
  Filled 2012-02-26: qty 100

## 2012-02-26 NOTE — Progress Notes (Signed)
1 Wearing Post-Op  Subjective: Moderate incisional pain.  Objective: Vital signs in last 24 hours: Temp:  [97.7 F (36.5 C)-98.8 F (37.1 C)] 97.9 F (36.6 C) (01/04 0617) Pulse Rate:  [54-105] 54  (01/04 0617) Resp:  [16-44] 16  (01/04 0617) BP: (87-129)/(51-73) 96/62 mmHg (01/04 0617) SpO2:  [83 %-100 %] 100 % (01/04 0617) Last BM Date: 02/11/12  Intake/Output from previous Droz: 01/03 0701 - 01/04 0700 In: 2861.8 [P.O.:600; I.V.:1761.8; IV Piggyback:500] Out: -  Intake/Output this shift:    General appearance: alert, cooperative and no distress Resp: clear to auscultation bilaterally Cardio: regular rate and rhythm, S1, S2 normal, no murmur, click, rub or gallop GI: Packing removed from perirectal region.  Lab Results:   Basename 02/26/12 0551 02/24/12 1419  WBC 12.7* 18.9*  HGB 9.6* 11.5*  HCT 27.9* 33.1*  PLT 305 340   BMET  Basename 02/26/12 0551 02/24/12 2216  NA 135 133*  K 3.3* 3.2*  CL 98 95*  CO2 30 29  GLUCOSE 110* 119*  BUN 6 5*  CREATININE 0.65 0.73  CALCIUM 8.9 8.8   PT/INR No results found for this basename: LABPROT:2,INR:2 in the last 72 hours  Studies/Results: No results found.  Anti-infectives: Anti-infectives     Start     Dose/Rate Route Frequency Ordered Stop   02/25/12 2200   ciprofloxacin (CIPRO) IVPB 400 mg        400 mg 200 mL/hr over 60 Minutes Intravenous Every 12 hours 02/25/12 1147     02/25/12 1400   metroNIDAZOLE (FLAGYL) IVPB 500 mg        500 mg 100 mL/hr over 60 Minutes Intravenous Every 8 hours 02/25/12 1147     02/24/12 1800   ciprofloxacin (CIPRO) IVPB 400 mg  Status:  Discontinued        400 mg 200 mL/hr over 60 Minutes Intravenous Every 12 hours 02/24/12 1659 02/25/12 1147   02/24/12 1730   metroNIDAZOLE (FLAGYL) IVPB 500 mg  Status:  Discontinued        500 mg 100 mL/hr over 60 Minutes Intravenous Every 8 hours 02/24/12 1659 02/25/12 1147          Assessment/Plan: s/p Procedure(s): IRRIGATION AND  DEBRIDEMENT PERIRECTAL ABSCESS Impression: Stable, postoperative [place] one. Will start sitz baths. Continue IV antibiotics. We'll treat hypokalemia. Anticipate discharge in next 24-48 hours.  LOS: 2 days    Renad Jenniges A 02/26/2012

## 2012-02-26 NOTE — Progress Notes (Signed)
02/26/12 1558 Patient assisted with sitz bath this afternoon per nurse tech. Pt states tolerated well and had some relief of discomfort to surgical site after using sitz bath. Earnstine Regal, RN

## 2012-02-26 NOTE — Progress Notes (Signed)
02/26/12 1559 Patient c/o discomfort to her ears this afternoon, requested to have her ears flushed out. Stated has problem with ear discomfort and having to have them flushed, but just goes to emergency department when needed. On assessment, noted to have wax buildup to both ears. Notified Dr Lovell Sheehan of patient request, stated discuss with ER nurse to find out method used for flushing ears and then may do so. ER charge nurse, stated they use mixture of warm water and peroxide instilled to ear, they will drain on own.  Nursing to monitor. Earnstine Regal, RN

## 2012-02-26 NOTE — Plan of Care (Signed)
Problem: Phase III Progression Outcomes Goal: Activity at appropriate level-compared to baseline (UP IN CHAIR FOR HEMODIALYSIS)  Outcome: Completed/Met Date Met:  02/26/12 02/26/12 1556 Patient up in room, ambulates in room frequently. Instructed to call for assistance as needed or if any weakness/dizziness.

## 2012-02-26 NOTE — Progress Notes (Signed)
02/26/12 1755 Irrigated patient's left ear with mixture of warm sterile water and peroxide solution as instructed.  Moderate amount of sediment flushed from ear. Stated she felt some relief. Patient preferred to wait until later tonight for right ear to be irrigated.  Earnstine Regal, RN

## 2012-02-27 LAB — CULTURE, ROUTINE-ABSCESS

## 2012-02-27 LAB — BASIC METABOLIC PANEL
BUN: 6 mg/dL (ref 6–23)
Chloride: 99 mEq/L (ref 96–112)
GFR calc Af Amer: >90 mL/min (ref >90)
Glucose, Bld: 90 mg/dL (ref 70–99)
Potassium: 3.8 mEq/L (ref 3.5–5.1)
Sodium: 135 mEq/L (ref 135–145)

## 2012-02-27 LAB — CBC
HCT: 32 % — ABNORMAL LOW (ref 36.0–46.0)
Hemoglobin: 10.7 g/dL — ABNORMAL LOW (ref 12.0–15.0)
RDW: 15.8 % — ABNORMAL HIGH (ref 11.5–15.5)
WBC: 8.5 10*3/uL (ref 4.0–10.5)

## 2012-02-27 MED ORDER — OXYCODONE-ACETAMINOPHEN 7.5-325 MG PO TABS: 1.0000 | ORAL_TABLET | ORAL | Status: DC | PRN

## 2012-02-27 MED ORDER — CIPROFLOXACIN HCL 500 MG PO TABS: 500.0000 mg | ORAL_TABLET | Freq: Two times a day (BID) | ORAL | Status: DC

## 2012-02-27 MED ORDER — CIPROFLOXACIN HCL 250 MG PO TABS
500.0000 mg | ORAL_TABLET | Freq: Once | ORAL | Status: AC
  Administered 2012-02-27: 500 mg via ORAL
  Filled 2012-02-27: qty 2

## 2012-02-27 MED ORDER — METRONIDAZOLE 500 MG PO TABS
250.0000 mg | ORAL_TABLET | Freq: Once | ORAL | Status: AC
  Administered 2012-02-27: 250 mg via ORAL
  Filled 2012-02-27: qty 1

## 2012-02-27 MED ORDER — METRONIDAZOLE 250 MG PO TABS: 250.0000 mg | ORAL_TABLET | Freq: Three times a day (TID) | ORAL | Status: DC

## 2012-02-27 NOTE — Progress Notes (Signed)
02/27/11 1130 Patient being discharged home today. IV site d/c'd and within normal limits. Sitz bath completed at patient request this morning as ordered. Reviewed discharge instructions with patient, given copy of instructions, medication list, f/u appointment information, prescriptions. Verbalized understanding. Pt stated may not be able to afford prescriptions, notified nursing supervisor. Stated voucher only good for [employer], already closed for today. Notified Dr Lovell Sheehan, order received to give dose of cipro 500 mg po x 1 and flagyl 250 mg po x 1 prior to discharge and patient would be okay until tomorrow when she can speak with case management regarding need for prescription voucher. Also stated to have patient check with [facility] because generic prescriptions should be $4 each.  Stated patient may return to work at any time, no restrictions on activity. Pt stated would check with Dr Lovell Sheehan office tomorrow regarding need for work note. Discussed with patient that no voucher can be provided for percocet prescription, stated she understood and would check at [facility]. Pt in stable condition awaiting family arrival for discharge home. Earnstine Regal, RN

## 2012-02-27 NOTE — Progress Notes (Signed)
02/27/12 1236 Patient left floor in stable condition via ambulation accompanied by nursing staff member.

## 2012-02-27 NOTE — Discharge Summary (Signed)
Physician Discharge Summary  Patient ID: Susan Greene MRN: [id number] DOB/AGE: 1985-05-12 26 y.o.  Admit date: 02/24/2012 Discharge date: 02/27/2012  Admission Diagnoses: Perirectal abscess  Discharge Diagnoses: Same Active Problems:  * No active hospital problems. *    Discharged Condition: good  Hospital Course: Patient is a 27 year old black female presented emergency room with worsening perirectal pain. Surgery was consulted and the patient was admitted to the hospital for further evaluation treatment. She was started on IV ciprofloxacin and Flagyl. She subsequently underwent incision and drainage of the perirectal abscess on 02/25/2012. Enlarged some muscularis abscess was found. She tolerated the procedure well. Her leukocytosis resolved. Her diet was advanced at difficulty. She denies any incontinence. The patient is being discharged home on 02/27/2012 in good improving condition.  Treatments: surgery: Incision and drainage of perirectal abscess, fistulotomy on 02/25/2012  Discharge Exam: Blood pressure 107/61, pulse 54, temperature 97.7 F (36.5 C), temperature source Oral, resp. rate 20, height 5\' 5"  (1.651 m), weight 58.968 kg (130 lb), last menstrual period 01/24/2012, SpO2 98.00%. General appearance: alert, cooperative and no distress Resp: clear to auscultation bilaterally Cardio: regular rate and rhythm, S1, S2 normal, no murmur, click, rub or gallop GI: Some serosanguineous drainage from perianal incision.  Disposition: 01-Home or Self Care     Medication List     As of 02/27/2012  8:17 AM    TAKE these medications         acetaminophen 500 MG tablet   Commonly known as: TYLENOL   Take 1,000 mg by mouth every 6 (six) hours as needed. pain      ciprofloxacin 500 MG tablet   Commonly known as: CIPRO   Take 1 tablet (500 mg total) by mouth 2 (two) times daily.      ibuprofen 200 MG tablet   Commonly known as: ADVIL,MOTRIN   Take 800 mg by mouth every 6 (six)  hours as needed. pain      metroNIDAZOLE 250 MG tablet   Commonly known as: FLAGYL   Take 1 tablet (250 mg total) by mouth 3 (three) times daily.      oxyCODONE-acetaminophen 7.5-325 MG per tablet   Commonly known as: PERCOCET   Take 1-2 tablets by mouth every 4 (four) hours as needed for pain.           Follow-up Information    Follow up with Dalia Heading, MD. Schedule an appointment as soon as possible for a visit on 03/02/2012.   Contact information:   [address] [place] [address] [phone]          Signed: Franky Macho A 02/27/2012, 8:17 AM

## 2012-02-27 NOTE — Addendum Note (Signed)
Addendum  created 02/27/12 1309 by Moshe Salisbury, CRNA   Modules edited:Notes Section

## 2012-02-29 ENCOUNTER — Encounter (HOSPITAL_COMMUNITY): Payer: Self-pay | Admitting: General Surgery

## 2012-03-03 LAB — ANAEROBIC CULTURE

## 2012-03-03 NOTE — Addendum Note (Signed)
Addendum  created 03/03/12 1036 by Maryum Batterson, MD   Modules edited:Anesthesia Attestations, Anesthesia Events    

## 2012-03-03 NOTE — Addendum Note (Signed)
Addendum  created 03/03/12 1036 by Roselie Awkward, MD   Modules edited:Anesthesia Attestations, Anesthesia Events

## 2012-06-18 ENCOUNTER — Emergency Department (HOSPITAL_COMMUNITY): Payer: No Typology Code available for payment source

## 2012-06-18 ENCOUNTER — Emergency Department (HOSPITAL_COMMUNITY)
Admission: EM | Admit: 2012-06-18 | Discharge: 2012-06-18 | Disposition: A | Discharge status: Home / Self Care | Payer: No Typology Code available for payment source | Attending: Emergency Medicine | Admitting: Emergency Medicine

## 2012-06-18 ENCOUNTER — Encounter (HOSPITAL_COMMUNITY): Payer: Self-pay | Admitting: *Deleted

## 2012-06-18 DIAGNOSIS — S39012A Strain of muscle, fascia and tendon of lower back, initial encounter: Secondary | ICD-10-CM

## 2012-06-18 DIAGNOSIS — Y929 Unspecified place or not applicable: Secondary | ICD-10-CM | POA: Insufficient documentation

## 2012-06-18 DIAGNOSIS — X58XXXA Exposure to other specified factors, initial encounter: Secondary | ICD-10-CM | POA: Insufficient documentation

## 2012-06-18 DIAGNOSIS — Z3202 Encounter for pregnancy test, result negative: Secondary | ICD-10-CM | POA: Insufficient documentation

## 2012-06-18 DIAGNOSIS — Y939 Activity, unspecified: Secondary | ICD-10-CM | POA: Insufficient documentation

## 2012-06-18 DIAGNOSIS — S335XXA Sprain of ligaments of lumbar spine, initial encounter: Secondary | ICD-10-CM | POA: Insufficient documentation

## 2012-06-18 DIAGNOSIS — F17200 Nicotine dependence, unspecified, uncomplicated: Secondary | ICD-10-CM | POA: Insufficient documentation

## 2012-06-18 LAB — URINALYSIS, ROUTINE W REFLEX MICROSCOPIC
Bilirubin Urine: NEGATIVE
Nitrite: NEGATIVE
Specific Gravity, Urine: 1.025 (ref 1.005–1.030)
Urobilinogen, UA: 0.2 mg/dL (ref 0.0–1.0)
pH: 6.5 (ref 5.0–8.0)

## 2012-06-18 LAB — POCT PREGNANCY, URINE: Preg Test, Ur: NEGATIVE

## 2012-06-18 MED ORDER — OXYCODONE-ACETAMINOPHEN 5-325 MG PO TABS
1.0000 | ORAL_TABLET | Freq: Once | ORAL | Status: AC
  Administered 2012-06-18: 1 via ORAL
  Filled 2012-06-18: qty 1

## 2012-06-18 MED ORDER — HYDROCODONE-ACETAMINOPHEN 5-325 MG PO TABS: ORAL_TABLET | ORAL | Status: DC

## 2012-06-18 MED ORDER — CYCLOBENZAPRINE HCL 10 MG PO TABS: 10.0000 mg | ORAL_TABLET | Freq: Three times a day (TID) | ORAL | Status: DC | PRN

## 2012-06-18 MED ORDER — CYCLOBENZAPRINE HCL 10 MG PO TABS
10.0000 mg | ORAL_TABLET | Freq: Once | ORAL | Status: AC
  Administered 2012-06-18: 10 mg via ORAL
  Filled 2012-06-18: qty 1

## 2012-06-18 MED ORDER — NAPROXEN 500 MG PO TABS: 500.0000 mg | ORAL_TABLET | Freq: Two times a day (BID) | ORAL | Status: DC

## 2012-06-18 NOTE — ED Notes (Signed)
nad noted prior to dc. Dc instructions reviewed with pt and 3 scripts given to pt. Ambulated out without difficulty.

## 2012-06-18 NOTE — ED Notes (Signed)
Pt c/o lower back pain that became worse a week ago, movement makes the pain worse, denies any radiation, denies any injury.

## 2012-06-18 NOTE — ED Provider Notes (Signed)
History     CSN: [id number]  Arrival date & time 06/18/12  1540   First MD Initiated Contact with Patient 06/18/12 1554      Chief Complaint  Patient presents with  . Back Pain    (Consider location/radiation/quality/duration/timing/severity/associated sxs/prior treatment) HPI Comments: Patient c/o pain to her midline lower back and left paraspinal muscles.  States the pain began gradually one week ago and has been persistent since.  She states the pain is worse with movement and improves somewhat when she lies still.  She has been taking tylenol w/o relief.  She denies fever, dysuria, incontinence of bladder or bowel, abd pain, or pelvic pain or vomiting  Patient is a 27 y.o. female presenting with back pain. The history is provided by the patient.  Back Pain Location:  Lumbar spine Quality:  Aching Radiates to:  Does not radiate Pain severity:  Moderate Pain is:  Same all the time Onset quality:  Gradual Duration:  1 week Timing:  Constant Progression:  Worsening Chronicity:  New Context: not falling, not lifting heavy objects, not pedestrian accident, not recent illness, not recent injury and not twisting   Relieved by:  Being still Worsened by:  Ambulation, bending, twisting, standing, movement and sitting Ineffective treatments:  OTC medications Associated symptoms: no abdominal pain, no abdominal swelling, no bladder incontinence, no bowel incontinence, no chest pain, no dysuria, no fever, no headaches, no leg pain, no numbness, no paresthesias, no pelvic pain, no perianal numbness, no tingling and no weakness     Past Medical History  Diagnosis Date  . PONV (postoperative nausea and vomiting)     Past Surgical History  Procedure Laterality Date  . Knee arthroscopy  Right knee  . Incision and drainage perirectal abscess  02/25/2012    Procedure: IRRIGATION AND DEBRIDEMENT PERIRECTAL ABSCESS;  Surgeon: Dalia Heading, MD;  Location: [facility];  Service: General;   Laterality: N/A;  Fistulotomy    No family history on file.  History  Substance Use Topics  . Smoking status: Current Every [place] Smoker -- 0.25 packs/[place]    Types: Cigarettes  . Smokeless tobacco: Not on file  . Alcohol Use: 14.4 oz/week    24 Cans of beer per week    OB History   Grav Para Term Preterm Abortions TAB SAB Ect Mult Living                  Review of Systems  Constitutional: Negative for fever, activity change and appetite change.  Respiratory: Negative for shortness of breath.   Cardiovascular: Negative for chest pain.  Gastrointestinal: Negative for nausea, vomiting, abdominal pain, constipation and bowel incontinence.  Genitourinary: Negative for bladder incontinence, dysuria, frequency, hematuria, flank pain, decreased urine volume, vaginal bleeding, vaginal discharge, difficulty urinating, vaginal pain and pelvic pain.       No perineal numbness or incontinence of urine or feces  Musculoskeletal: Positive for back pain. Negative for joint swelling and gait problem.  Skin: Negative for rash.  Neurological: Negative for tingling, weakness, numbness, headaches and paresthesias.  All other systems reviewed and are negative.    Allergies  Review of patient's allergies indicates no known allergies.  Home Medications   Current Outpatient Rx  Name  Route  Sig  Dispense  Refill  . acetaminophen (TYLENOL) 500 MG tablet   Oral   Take 1,000 mg by mouth every 6 (six) hours as needed. pain          . ciprofloxacin (CIPRO)  500 MG tablet   Oral   Take 1 tablet (500 mg total) by mouth 2 (two) times daily.   20 tablet   0   . ibuprofen (ADVIL,MOTRIN) 200 MG tablet   Oral   Take 800 mg by mouth every 6 (six) hours as needed. pain          . metroNIDAZOLE (FLAGYL) 250 MG tablet   Oral   Take 1 tablet (250 mg total) by mouth 3 (three) times daily.   30 tablet   0   . oxyCODONE-acetaminophen (PERCOCET) 7.5-325 MG per tablet   Oral   Take 1-2 tablets by  mouth every 4 (four) hours as needed for pain.   40 tablet   0     BP 124/77  Pulse 110  Temp(Src) 97.2 F (36.2 C)  Resp 20  SpO2 100%  Physical Exam  Nursing note and vitals reviewed. Constitutional: She is oriented to person, place, and time. She appears well-developed and well-nourished. No distress.  HENT:  Head: Normocephalic and atraumatic.  Neck: Normal range of motion. Neck supple.  Cardiovascular: Normal rate, regular rhythm, normal heart sounds and intact distal pulses.   No murmur heard. Pulmonary/Chest: Effort normal and breath sounds normal. No respiratory distress. She exhibits no tenderness.  Musculoskeletal: She exhibits tenderness. She exhibits no edema.       Lumbar back: She exhibits tenderness and pain. She exhibits normal range of motion, no swelling, no deformity, no laceration and normal pulse.       Back:  Lower midline ttp of the lumbar spine and left paraspinal muscles.  No erythema or edema.  DP pulses are brisk and symmetrical, distal sensation intact, no calf pain or edema.  Hip flexors/extensors intact.  SLR is negative bilaterally.    Neurological: She is alert and oriented to person, place, and time. No cranial nerve deficit or sensory deficit. She exhibits normal muscle tone. Coordination and gait normal.  Reflex Scores:      Patellar reflexes are 2+ on the right side and 2+ on the left side.      Achilles reflexes are 2+ on the right side and 2+ on the left side. Skin: Skin is warm and dry.    ED Course  Procedures (including critical care time)    Results for orders placed during the hospital encounter of 06/18/12  URINALYSIS, ROUTINE W REFLEX MICROSCOPIC      Result Value Range   Color, Urine YELLOW  YELLOW   APPearance HAZY (*) CLEAR   Specific Gravity, Urine 1.025  1.005 - 1.030   pH 6.5  5.0 - 8.0   Glucose, UA NEGATIVE  NEGATIVE mg/dL   Hgb urine dipstick NEGATIVE  NEGATIVE   Bilirubin Urine NEGATIVE  NEGATIVE   Ketones, ur  TRACE (*) NEGATIVE mg/dL   Protein, ur NEGATIVE  NEGATIVE mg/dL   Urobilinogen, UA 0.2  0.0 - 1.0 mg/dL   Nitrite NEGATIVE  NEGATIVE   Leukocytes, UA NEGATIVE  NEGATIVE  POCT PREGNANCY, URINE      Result Value Range   Preg Test, Ur NEGATIVE  NEGATIVE    Dg Lumbar Spine Complete  06/18/2012  *RADIOLOGY REPORT*  Clinical Data: 27 year old female with low back pain.  LUMBAR SPINE - COMPLETE 4+ VIEW  Comparison: None  Findings: Five non-rib bearing lumbar type vertebra are identified in normal alignment. There is no evidence of fracture or subluxation. The disc spaces are maintained. No focal bony lesions or spondylolysis identified.  IMPRESSION: Unremarkable  lumbar spine series.   Original Report Authenticated By: Harmon Pier, M.D.       MDM    Patient has ttp of the lumbar spine and left paraspinal muscles.  No focal neuro deficits on exam.  Ambulates with a steady gait.   abd is soft, NT.  VSS.  She is non-toxic appearing.  Pain likely  muscloskeletal .  Doubt emergent neurological or infectious process.    Pt agrees to rest, ice, close f/u with her PMD.  Appears stable for discharge.          Gavyn Zoss L. Trisha Mangle, PA-C 06/18/12 1716

## 2012-06-21 NOTE — ED Provider Notes (Signed)
Medical screening examination/treatment/procedure(s) were performed by non-physician practitioner and as supervising physician I was immediately available for consultation/collaboration.  Arcadio Cope J. Campbell Kray, MD 06/21/12 1205 

## 2012-07-17 ENCOUNTER — Emergency Department (HOSPITAL_COMMUNITY)
Admission: EM | Admit: 2012-07-17 | Discharge: 2012-07-17 | Disposition: A | Discharge status: Home / Self Care | Payer: No Typology Code available for payment source | Attending: Emergency Medicine | Admitting: Emergency Medicine

## 2012-07-17 ENCOUNTER — Encounter (HOSPITAL_COMMUNITY): Payer: Self-pay | Admitting: *Deleted

## 2012-07-17 DIAGNOSIS — F17200 Nicotine dependence, unspecified, uncomplicated: Secondary | ICD-10-CM | POA: Insufficient documentation

## 2012-07-17 DIAGNOSIS — M545 Low back pain, unspecified: Secondary | ICD-10-CM

## 2012-07-17 MED ORDER — KETOROLAC TROMETHAMINE 10 MG PO TABS
10.0000 mg | ORAL_TABLET | Freq: Once | ORAL | Status: AC
  Administered 2012-07-17: 10 mg via ORAL
  Filled 2012-07-17: qty 1

## 2012-07-17 MED ORDER — ONDANSETRON HCL 4 MG PO TABS
4.0000 mg | ORAL_TABLET | Freq: Once | ORAL | Status: AC
  Administered 2012-07-17: 4 mg via ORAL
  Filled 2012-07-17: qty 1

## 2012-07-17 MED ORDER — METHOCARBAMOL 500 MG PO TABS: 500.0000 mg | ORAL_TABLET | Freq: Three times a day (TID) | ORAL | Status: DC

## 2012-07-17 MED ORDER — HYDROCODONE-ACETAMINOPHEN 5-325 MG PO TABS
2.0000 | ORAL_TABLET | Freq: Once | ORAL | Status: AC
  Administered 2012-07-17: 2 via ORAL
  Filled 2012-07-17: qty 2

## 2012-07-17 MED ORDER — HYDROCODONE-ACETAMINOPHEN 5-325 MG PO TABS: 1.0000 | ORAL_TABLET | ORAL | Status: DC | PRN

## 2012-07-17 MED ORDER — DICLOFENAC SODIUM 75 MG PO TBEC: 75.0000 mg | DELAYED_RELEASE_TABLET | Freq: Two times a day (BID) | ORAL | Status: DC

## 2012-07-17 NOTE — ED Provider Notes (Signed)
Medical screening examination/treatment/procedure(s) were performed by non-physician practitioner and as supervising physician I was immediately available for consultation/collaboration.   Kennetta Pavlovic, MD 07/17/12 2236 

## 2012-07-17 NOTE — ED Notes (Signed)
States she was seen 3 weeks ago for back pain and it is getting worse, has not seen another provider for same

## 2012-07-17 NOTE — ED Notes (Signed)
Low back pain for 2- 3 mos. No known injury.

## 2012-07-17 NOTE — ED Provider Notes (Signed)
History     CSN: [id number]  Arrival date & time 07/17/12  1909   First MD Initiated Contact with Patient 07/17/12 1920      Chief Complaint  Patient presents with  . Back Pain    (Consider location/radiation/quality/duration/timing/severity/associated sxs/prior treatment) Patient is a 27 y.o. female presenting with back pain. The history is provided by the patient.  Back Pain Location:  Lumbar spine Quality:  Aching and cramping Radiates to:  Does not radiate Pain severity:  Moderate Pain is:  Same all the time Onset quality:  Gradual Timing:  Intermittent Progression:  Worsening Context: not jumping from heights, not MVA, not pedestrian accident, not recent illness and not recent injury   Relieved by:  Lying down (Partial relief by lying down.) Worsened by:  Standing and palpation Ineffective treatments:  NSAIDs Associated symptoms: no abdominal pain, no bladder incontinence, no bowel incontinence, no chest pain, no dysuria and no perianal numbness     Past Medical History  Diagnosis Date  . PONV (postoperative nausea and vomiting)     Past Surgical History  Procedure Laterality Date  . Knee arthroscopy  Right knee  . Incision and drainage perirectal abscess  02/25/2012    Procedure: IRRIGATION AND DEBRIDEMENT PERIRECTAL ABSCESS;  Surgeon: Dalia Heading, MD;  Location: [facility];  Service: General;  Laterality: N/A;  Fistulotomy    No family history on file.  History  Substance Use Topics  . Smoking status: Current Every [place] Smoker -- 0.25 packs/[place]    Types: Cigarettes  . Smokeless tobacco: Not on file  . Alcohol Use: 14.4 oz/week    24 Cans of beer per week    OB History   Grav Para Term Preterm Abortions TAB SAB Ect Mult Living                  Review of Systems  Constitutional: Negative for activity change.       All ROS Neg except as noted in HPI  HENT: Negative for nosebleeds and neck pain.   Eyes: Negative for photophobia and discharge.   Respiratory: Negative for cough, shortness of breath and wheezing.   Cardiovascular: Negative for chest pain and palpitations.  Gastrointestinal: Negative for abdominal pain, blood in stool and bowel incontinence.  Genitourinary: Negative for bladder incontinence, dysuria, frequency and hematuria.  Musculoskeletal: Positive for back pain. Negative for arthralgias.  Skin: Negative.   Neurological: Negative for dizziness, seizures and speech difficulty.  Psychiatric/Behavioral: Negative for hallucinations and confusion.    Allergies  Review of patient's allergies indicates no known allergies.  Home Medications   Current Outpatient Rx  Name  Route  Sig  Dispense  Refill  . acetaminophen (TYLENOL) 500 MG tablet   Oral   Take 1,000 mg by mouth every 6 (six) hours as needed. pain          . cyclobenzaprine (FLEXERIL) 10 MG tablet   Oral   Take 1 tablet (10 mg total) by mouth 3 (three) times daily as needed.   21 tablet   0   . HYDROcodone-acetaminophen (NORCO/VICODIN) 5-325 MG per tablet      Take one-two tabs po q 4-6 hrs prn pain   15 tablet   0   . ibuprofen (ADVIL,MOTRIN) 200 MG tablet   Oral   Take 800 mg by mouth every 6 (six) hours as needed. pain          . naproxen (NAPROSYN) 500 MG tablet   Oral  Take 1 tablet (500 mg total) by mouth 2 (two) times daily with a meal.   20 tablet   0     BP 136/85  Pulse 98  Temp(Src) 98.2 F (36.8 C) (Oral)  Resp 24  Ht 5\' 5"  (1.651 m)  Wt 135 lb (61.236 kg)  BMI 22.47 kg/m2  SpO2 100%  Physical Exam  Nursing note and vitals reviewed. Constitutional: She is oriented to person, place, and time. She appears well-developed and well-nourished.  Non-toxic appearance.  HENT:  Head: Normocephalic.  Right Ear: Tympanic membrane and external ear normal.  Left Ear: Tympanic membrane and external ear normal.  Eyes: EOM and lids are normal. Pupils are equal, round, and reactive to light.  Neck: Normal range of motion. Neck  supple. Carotid bruit is not present.  Cardiovascular: Normal rate, regular rhythm, normal heart sounds, intact distal pulses and normal pulses.   Pulmonary/Chest: Breath sounds normal. No respiratory distress.  Abdominal: Soft. Bowel sounds are normal. There is no tenderness. There is no guarding.  Musculoskeletal: Normal range of motion.  There is pain in the lumbar area with palpation and with change of position and with attempted range of motion. There is some mild paraspinal tightness and tenseness noted. There no hot areas appreciated. No palpable deformity or step-off.  Lymphadenopathy:       Head (right side): No submandibular adenopathy present.       Head (left side): No submandibular adenopathy present.    She has no cervical adenopathy.  Neurological: She is alert and oriented to person, place, and time. She has normal strength. No cranial nerve deficit or sensory deficit. She exhibits normal muscle tone. Coordination normal.  No foot drop noted.  Skin: Skin is warm and dry.  Psychiatric: She has a normal mood and affect. Her speech is normal.    ED Course  Procedures (including critical care time)  Labs Reviewed - No data to display No results found.   No diagnosis found.    MDM  I have reviewed nursing notes, vital signs, and all appropriate lab and imaging results for this patient. Patient states she was seen approximately 3-4 weeks ago with back pain and at that time had a negative x-ray pregnancy tests and urine tests. She was advised to use conservative measures for her back. Since that time she has been seen by one of the urgent care offices in the [facility] area she was placed on Flexeril and Naprosyn without success. The patient states her pain is now getting worse and she presents to the emergency department for additional evaluation.  The vital signs are well within normal limits. There is pain to palpation of the lumbar area there is some paraspinal  tenderness and tenseness of present. There no gross neurologic deficits appreciated on today's examination.  The plan at this time is for the patient to continue to alternate heat and ice to the back. We'll use Robaxin and Norco and diclofenac. Patient advised to see Dr. Victorino Dike for additional evaluation of her back.       Kathie Dike, PA-C 07/17/12 1945

## 2012-07-24 ENCOUNTER — Emergency Department (HOSPITAL_COMMUNITY)
Admission: EM | Admit: 2012-07-24 | Discharge: 2012-07-24 | Disposition: A | Discharge status: Home / Self Care | Payer: Medicaid - Out of State | Attending: Emergency Medicine | Admitting: Emergency Medicine

## 2012-07-24 ENCOUNTER — Encounter (HOSPITAL_COMMUNITY): Payer: Self-pay | Admitting: *Deleted

## 2012-07-24 DIAGNOSIS — M545 Low back pain, unspecified: Secondary | ICD-10-CM | POA: Insufficient documentation

## 2012-07-24 DIAGNOSIS — M549 Dorsalgia, unspecified: Secondary | ICD-10-CM

## 2012-07-24 DIAGNOSIS — F17200 Nicotine dependence, unspecified, uncomplicated: Secondary | ICD-10-CM | POA: Insufficient documentation

## 2012-07-24 DIAGNOSIS — Z3202 Encounter for pregnancy test, result negative: Secondary | ICD-10-CM | POA: Insufficient documentation

## 2012-07-24 DIAGNOSIS — Z79899 Other long term (current) drug therapy: Secondary | ICD-10-CM | POA: Insufficient documentation

## 2012-07-24 LAB — URINALYSIS, ROUTINE W REFLEX MICROSCOPIC
Bilirubin Urine: NEGATIVE
Ketones, ur: NEGATIVE mg/dL
Leukocytes, UA: NEGATIVE
Nitrite: NEGATIVE
Protein, ur: NEGATIVE mg/dL
Urobilinogen, UA: 0.2 mg/dL (ref 0.0–1.0)
pH: 6 (ref 5.0–8.0)

## 2012-07-24 LAB — PREGNANCY, URINE: Preg Test, Ur: NEGATIVE

## 2012-07-24 MED ORDER — CYCLOBENZAPRINE HCL 10 MG PO TABS: 10.0000 mg | ORAL_TABLET | Freq: Two times a day (BID) | ORAL | Status: DC | PRN

## 2012-07-24 MED ORDER — PREDNISONE 10 MG PO TABS: 20.0000 mg | ORAL_TABLET | Freq: Two times a day (BID) | ORAL | Status: DC

## 2012-07-24 NOTE — ED Provider Notes (Signed)
History     CSN: [id number]  Arrival date & time 07/24/12  1453   First MD Initiated Contact with Patient 07/24/12 1633      Chief Complaint  Patient presents with  . Back Pain    (Consider location/radiation/quality/duration/timing/severity/associated sxs/prior treatment) Patient is a 27 y.o. female presenting with back pain. The history is provided by the patient.  Back Pain Location:  Lumbar spine Quality:  Aching (throbbing) Radiates to:  Does not radiate Pain severity:  Severe Pain is:  Same all the time Onset quality:  Gradual Duration:  4 days Progression:  Worsening Chronicity:  Recurrent Relieved by:  Muscle relaxants and narcotics Worsened by:  Movement and standing Ineffective treatments:  Ibuprofen Associated symptoms: no abdominal pain, no dysuria, no fever and no headaches    Susan Greene is a 27 y.o. female who presents to the ED with low back pain. The pain is recurrent. She has had several visits to the ED for same. Last visit 07/16/12 and treated with muscle relaxants and narcotics and it helped some. Took all the medication. She is requesting MRI and would like it to be scheduled in [place] where she lives. She does not have a primary care doctor and has not seen an orthopedic doctor about her back. She denies loss of control of bladder or bowels, no UTI symptoms. No nausea, vomiting or other problems.   Past Medical History  Diagnosis Date  . PONV (postoperative nausea and vomiting)     Past Surgical History  Procedure Laterality Date  . Knee arthroscopy  Right knee  . Incision and drainage perirectal abscess  02/25/2012    Procedure: IRRIGATION AND DEBRIDEMENT PERIRECTAL ABSCESS;  Surgeon: Dalia Heading, MD;  Location: [facility];  Service: General;  Laterality: N/A;  Fistulotomy    History reviewed. No pertinent family history.  History  Substance Use Topics  . Smoking status: Current Every [place] Smoker -- 0.25 packs/[place]    Types: Cigarettes  .  Smokeless tobacco: Not on file  . Alcohol Use: 14.4 oz/week    24 Cans of beer per week    OB History   Grav Para Term Preterm Abortions TAB SAB Ect Mult Living                  Review of Systems  Constitutional: Negative for fever.  HENT: Negative for neck pain.   Respiratory: Negative for shortness of breath.   Gastrointestinal: Negative for nausea, vomiting and abdominal pain.  Genitourinary: Negative for dysuria, urgency and frequency.  Musculoskeletal: Positive for back pain.  Skin: Negative for wound.  Neurological: Negative for headaches.  Psychiatric/Behavioral: The patient is not nervous/anxious.     Allergies  Review of patient's allergies indicates no known allergies.  Home Medications   Current Outpatient Rx  Name  Route  Sig  Dispense  Refill  . acetaminophen (TYLENOL) 500 MG tablet   Oral   Take 1,000 mg by mouth every 6 (six) hours as needed. pain          . diclofenac (VOLTAREN) 75 MG EC tablet   Oral   Take 1 tablet (75 mg total) by mouth 2 (two) times daily.   12 tablet   0   . ibuprofen (ADVIL,MOTRIN) 200 MG tablet   Oral   Take 800 mg by mouth every 6 (six) hours as needed. pain          . methocarbamol (ROBAXIN) 500 MG tablet   Oral  Take 1 tablet (500 mg total) by mouth 3 (three) times daily.   21 tablet   0     BP 136/73  Pulse 95  Temp(Src) 97.7 F (36.5 C) (Oral)  Resp 18  Ht 5\' 5"  (1.651 m)  Wt 135 lb (61.236 kg)  BMI 22.47 kg/m2  SpO2 100%  Physical Exam  Nursing note and vitals reviewed. Constitutional: She is oriented to person, place, and time. She appears well-developed and well-nourished. No distress.  HENT:  Head: Normocephalic.  Eyes: EOM are normal.  Neck: Neck supple.  Cardiovascular: Normal rate.   Pulmonary/Chest: Effort normal.  Abdominal: Soft. There is no tenderness.  Musculoskeletal:       Lumbar back: She exhibits decreased range of motion, tenderness, pain and spasm.       Back:  Neurological:  She is alert and oriented to person, place, and time. She has normal strength and normal reflexes. No cranial nerve deficit or sensory deficit. Coordination and gait normal.  Pedal pulses strong and equal bilateral  Skin: Skin is warm and dry.  Psychiatric: She has a normal mood and affect. Her behavior is normal.    ED Course  Procedures (including critical care time)  MDM  27 y.o. female with low back pain. I discussed with the patient need for follow up with ortho and to have a regular doctor to follow her. Will treat pain and spasm and refer to ortho. I have reviewed this patient's vital signs, nurses notes, past visits and discussed in detail with the patient plan of care. She voices understanding.   Medication List    TAKE these medications       cyclobenzaprine 10 MG tablet  Commonly known as:  FLEXERIL  Take 1 tablet (10 mg total) by mouth 2 (two) times daily as needed for muscle spasms.     predniSONE 10 MG tablet  Commonly known as:  DELTASONE  Take 2 tablets (20 mg total) by mouth 2 (two) times daily.      ASK your doctor about these medications       acetaminophen 500 MG tablet  Commonly known as:  TYLENOL  Take 1,000 mg by mouth every 6 (six) hours as needed. pain     ibuprofen 200 MG tablet  Commonly known as:  ADVIL,MOTRIN  Take 800 mg by mouth every 6 (six) hours as needed. pain               Janne Napoleon, NP 07/25/12 0157

## 2012-07-24 NOTE — ED Notes (Signed)
Low back pain for several mos, says she wants an MRI done No known injury

## 2012-07-25 NOTE — ED Provider Notes (Signed)
Medical screening examination/treatment/procedure(s) were performed by non-physician practitioner and as supervising physician I was immediately available for consultation/collaboration.  Lizanne Erker, MD 07/25/12 1514 

## 2013-03-31 ENCOUNTER — Encounter (HOSPITAL_COMMUNITY): Payer: Self-pay | Admitting: Emergency Medicine

## 2013-03-31 ENCOUNTER — Emergency Department (HOSPITAL_COMMUNITY)
Admission: EM | Admit: 2013-03-31 | Discharge: 2013-03-31 | Disposition: A | Discharge status: Home / Self Care | Payer: Medicaid - Out of State | Attending: Emergency Medicine | Admitting: Emergency Medicine

## 2013-03-31 DIAGNOSIS — Y939 Activity, unspecified: Secondary | ICD-10-CM | POA: Insufficient documentation

## 2013-03-31 DIAGNOSIS — M79609 Pain in unspecified limb: Secondary | ICD-10-CM | POA: Insufficient documentation

## 2013-03-31 DIAGNOSIS — S39012A Strain of muscle, fascia and tendon of lower back, initial encounter: Secondary | ICD-10-CM

## 2013-03-31 DIAGNOSIS — S335XXA Sprain of ligaments of lumbar spine, initial encounter: Secondary | ICD-10-CM | POA: Insufficient documentation

## 2013-03-31 DIAGNOSIS — X58XXXA Exposure to other specified factors, initial encounter: Secondary | ICD-10-CM | POA: Insufficient documentation

## 2013-03-31 DIAGNOSIS — Z79899 Other long term (current) drug therapy: Secondary | ICD-10-CM | POA: Insufficient documentation

## 2013-03-31 DIAGNOSIS — IMO0002 Reserved for concepts with insufficient information to code with codable children: Secondary | ICD-10-CM | POA: Insufficient documentation

## 2013-03-31 DIAGNOSIS — Z3202 Encounter for pregnancy test, result negative: Secondary | ICD-10-CM | POA: Insufficient documentation

## 2013-03-31 DIAGNOSIS — F17200 Nicotine dependence, unspecified, uncomplicated: Secondary | ICD-10-CM | POA: Insufficient documentation

## 2013-03-31 DIAGNOSIS — Y929 Unspecified place or not applicable: Secondary | ICD-10-CM | POA: Insufficient documentation

## 2013-03-31 HISTORY — DX: Dorsalgia, unspecified: M54.9

## 2013-03-31 LAB — URINALYSIS, ROUTINE W REFLEX MICROSCOPIC
Bilirubin Urine: NEGATIVE
Glucose, UA: NEGATIVE mg/dL
Hgb urine dipstick: NEGATIVE
Ketones, ur: NEGATIVE mg/dL
Leukocytes, UA: NEGATIVE
NITRITE: NEGATIVE
UROBILINOGEN UA: 0.2 mg/dL (ref 0.0–1.0)
pH: 6 (ref 5.0–8.0)

## 2013-03-31 LAB — POCT PREGNANCY, URINE: Preg Test, Ur: NEGATIVE

## 2013-03-31 LAB — URINE MICROSCOPIC-ADD ON

## 2013-03-31 MED ORDER — PREDNISONE 20 MG PO TABS: ORAL_TABLET | ORAL | Status: DC

## 2013-03-31 MED ORDER — HYDROCODONE-ACETAMINOPHEN 5-325 MG PO TABS: ORAL_TABLET | ORAL | Status: DC

## 2013-03-31 MED ORDER — CYCLOBENZAPRINE HCL 10 MG PO TABS
10.0000 mg | ORAL_TABLET | Freq: Once | ORAL | Status: AC
  Administered 2013-03-31: 10 mg via ORAL
  Filled 2013-03-31: qty 1

## 2013-03-31 MED ORDER — HYDROCODONE-ACETAMINOPHEN 5-325 MG PO TABS
1.0000 | ORAL_TABLET | Freq: Once | ORAL | Status: AC
  Administered 2013-03-31: 1 via ORAL
  Filled 2013-03-31: qty 1

## 2013-03-31 MED ORDER — CYCLOBENZAPRINE HCL 10 MG PO TABS: 10.0000 mg | ORAL_TABLET | Freq: Three times a day (TID) | ORAL | Status: DC | PRN

## 2013-03-31 NOTE — ED Notes (Signed)
Back pain for the past 2 days,, no known injury

## 2013-03-31 NOTE — ED Notes (Signed)
Urine pregnancy negative  

## 2013-03-31 NOTE — Discharge Instructions (Signed)
Back Pain, Adult  Back pain is very common. The pain often gets better over time. The cause of back pain is usually not dangerous. Most people can learn to manage their back pain on their own.   HOME CARE   · Stay active. Start with short walks on flat ground if you can. Try to walk farther each [place].  · Do not sit, drive, or stand in one place for more than 30 minutes. Do not stay in bed.  · Do not avoid exercise or work. Activity can help your back heal faster.  · Be careful when you bend or lift an object. Bend at your knees, keep the object close to you, and do not twist.  · Sleep on a firm mattress. Lie on your side, and bend your knees. If you lie on your back, put a pillow under your knees.  · Only take medicines as told by your doctor.  · Put ice on the injured area.  · Put ice in a plastic bag.  · Place a towel between your skin and the bag.  · Leave the ice on for 15-20 minutes, 03-04 times a [place] for the first 2 to 3 days. After that, you can switch between ice and heat packs.  · Ask your doctor about back exercises or massage.  · Avoid feeling anxious or stressed. Find good ways to deal with stress, such as exercise.  GET HELP RIGHT AWAY IF:   · Your pain does not go away with rest or medicine.  · Your pain does not go away in 1 week.  · You have new problems.  · You do not feel well.  · The pain spreads into your legs.  · You cannot control when you poop (bowel movement) or pee (urinate).  · Your arms or legs feel weak or lose feeling (numbness).  · You feel sick to your stomach (nauseous) or throw up (vomit).  · You have belly (abdominal) pain.  · You feel like you may pass out (faint).  MAKE SURE YOU:   · Understand these instructions.  · Will watch your condition.  · Will get help right away if you are not doing well or get worse.  Document Released: 07/28/2007 Document Revised: 05/03/2011 Document Reviewed: 06/29/2010  [employer]® Patient Information ©2014 [employer].

## 2013-03-31 NOTE — ED Provider Notes (Signed)
CSN: [id number]     Arrival date & time 03/31/13  1326 History   First MD Initiated Contact with Patient 03/31/13 1344     Chief Complaint  Patient presents with  . Back Pain   (Consider location/radiation/quality/duration/timing/severity/associated sxs/prior Treatment) Patient is a 28 y.o. female presenting with back pain. The history is provided by the patient.  Back Pain Location:  Lumbar spine Quality:  Aching Radiates to:  R posterior upper leg and L posterior upper leg Pain severity:  Moderate Pain is:  Same all the time Onset quality:  Gradual Duration:  2 days Timing:  Constant Progression:  Unchanged Chronicity:  New Context: twisting   Context: not falling and not recent illness   Relieved by:  Bed rest Worsened by:  Bending, twisting and sitting Ineffective treatments:  Ibuprofen Associated symptoms: leg pain   Associated symptoms: no abdominal pain, no abdominal swelling, no bladder incontinence, no bowel incontinence, no chest pain, no dysuria, no fever, no headaches, no numbness, no paresthesias, no pelvic pain, no perianal numbness, no tingling and no weakness     Past Medical History  Diagnosis Date  . PONV (postoperative nausea and vomiting)   . Back pain    Past Surgical History  Procedure Laterality Date  . Knee arthroscopy  Right knee  . Incision and drainage perirectal abscess  02/25/2012    Procedure: IRRIGATION AND DEBRIDEMENT PERIRECTAL ABSCESS;  Surgeon: Dalia Heading, MD;  Location: [facility];  Service: General;  Laterality: N/A;  Fistulotomy   No family history on file. History  Substance Use Topics  . Smoking status: Current Every [place] Smoker -- 0.25 packs/[place]    Types: Cigarettes  . Smokeless tobacco: Not on file  . Alcohol Use: 14.4 oz/week    24 Cans of beer per week   OB History   Grav Para Term Preterm Abortions TAB SAB Ect Mult Living                 Review of Systems  Constitutional: Negative for fever.  Respiratory: Negative for  shortness of breath.   Cardiovascular: Negative for chest pain.  Gastrointestinal: Negative for vomiting, abdominal pain, constipation and bowel incontinence.  Genitourinary: Negative for bladder incontinence, dysuria, hematuria, flank pain, decreased urine volume, difficulty urinating and pelvic pain.       No perineal numbness or incontinence of urine or feces  Musculoskeletal: Positive for back pain. Negative for joint swelling.  Skin: Negative for rash.  Neurological: Negative for tingling, weakness, numbness, headaches and paresthesias.  All other systems reviewed and are negative.    Allergies  Review of patient's allergies indicates no known allergies.  Home Medications   Current Outpatient Rx  Name  Route  Sig  Dispense  Refill  . acetaminophen (TYLENOL) 500 MG tablet   Oral   Take 1,000 mg by mouth every 6 (six) hours as needed. pain          . cyclobenzaprine (FLEXERIL) 10 MG tablet   Oral   Take 1 tablet (10 mg total) by mouth 2 (two) times daily as needed for muscle spasms.   20 tablet   0   . ibuprofen (ADVIL,MOTRIN) 200 MG tablet   Oral   Take 800 mg by mouth every 6 (six) hours as needed. pain          . predniSONE (DELTASONE) 10 MG tablet   Oral   Take 2 tablets (20 mg total) by mouth 2 (two) times daily.   20  tablet   0    BP 112/65  Pulse 80  Temp(Src) 98.7 F (37.1 C) (Oral)  Resp 18  Ht 5\' 5"  (1.651 m)  Wt 140 lb (63.504 kg)  BMI 23.30 kg/m2  SpO2 100%  LMP 03/12/2013 Physical Exam  Nursing note and vitals reviewed. Constitutional: She is oriented to person, place, and time. She appears well-developed and well-nourished. No distress.  HENT:  Head: Normocephalic and atraumatic.  Neck: Normal range of motion. Neck supple.  Cardiovascular: Normal rate, regular rhythm, normal heart sounds and intact distal pulses.   No murmur heard. Pulmonary/Chest: Effort normal and breath sounds normal. No respiratory distress.  Abdominal: Soft. She  exhibits no distension. There is no tenderness.  Musculoskeletal: Normal range of motion. She exhibits tenderness. She exhibits no edema.       Lumbar back: She exhibits tenderness and pain. She exhibits normal range of motion, no swelling, no deformity, no laceration and normal pulse.       Back:  ttp of the lumbar paraspinal muscles.  No spinal tenderness.  DP pulses are brisk and symmetrical.  Distal sensation intact.  Hip Flexors/Extensors are intact  Neurological: She is alert and oriented to person, place, and time. She has normal strength. No sensory deficit. She exhibits normal muscle tone. Coordination and gait normal.  Reflex Scores:      Patellar reflexes are 2+ on the right side and 2+ on the left side.      Achilles reflexes are 2+ on the right side and 2+ on the left side. Skin: Skin is warm and dry. No rash noted.    ED Course  Procedures (including critical care time) Labs Review Labs Reviewed  URINALYSIS, ROUTINE W REFLEX MICROSCOPIC - Abnormal; Notable for the following:    Specific Gravity, Urine >1.030 (*)    Protein, ur TRACE (*)    All other components within normal limits  URINE MICROSCOPIC-ADD ON - Abnormal; Notable for the following:    Squamous Epithelial / LPF MANY (*)    Bacteria, UA FEW (*)    All other components within normal limits  POCT PREGNANCY, URINE   Imaging Review No results found.  EKG Interpretation   None       MDM    [facility] narcotic [facility].  No recent narcotics.    No focal neuro deficits on exam.  Ambulates with a steady gait.   No concerning symptoms for emergent neurological or infectious process.  Likely lumbar strain.  Pt agrees to symptomatic treatment and close f/u with PMD.  Appears stable for d/c.    Deshondra Worst L. Trisha Mangleriplett, PA-C 04/01/13 2045

## 2013-04-03 NOTE — ED Provider Notes (Signed)
Medical screening examination/treatment/procedure(s) were performed by non-physician practitioner and as supervising physician I was immediately available for consultation/collaboration.  EKG Interpretation   None         Laray AngerKathleen M Aren Cherne, DO 04/03/13 1142

## 2013-05-06 ENCOUNTER — Emergency Department (HOSPITAL_COMMUNITY)
Admission: EM | Admit: 2013-05-06 | Discharge: 2013-05-06 | Disposition: A | Discharge status: Home / Self Care | Payer: Medicaid - Out of State | Attending: Emergency Medicine | Admitting: Emergency Medicine

## 2013-05-06 ENCOUNTER — Encounter (HOSPITAL_COMMUNITY): Payer: Self-pay | Admitting: Emergency Medicine

## 2013-05-06 DIAGNOSIS — Z3202 Encounter for pregnancy test, result negative: Secondary | ICD-10-CM | POA: Insufficient documentation

## 2013-05-06 DIAGNOSIS — IMO0002 Reserved for concepts with insufficient information to code with codable children: Secondary | ICD-10-CM | POA: Insufficient documentation

## 2013-05-06 DIAGNOSIS — F17200 Nicotine dependence, unspecified, uncomplicated: Secondary | ICD-10-CM | POA: Insufficient documentation

## 2013-05-06 DIAGNOSIS — Z79899 Other long term (current) drug therapy: Secondary | ICD-10-CM | POA: Insufficient documentation

## 2013-05-06 DIAGNOSIS — Z113 Encounter for screening for infections with a predominantly sexual mode of transmission: Secondary | ICD-10-CM | POA: Insufficient documentation

## 2013-05-06 LAB — WET PREP, GENITAL
TRICH WET PREP: NONE SEEN
WBC, Wet Prep HPF POC: NONE SEEN
YEAST WET PREP: NONE SEEN

## 2013-05-06 LAB — PREGNANCY, URINE: PREG TEST UR: NEGATIVE

## 2013-05-06 NOTE — ED Notes (Signed)
Pt here for STD check. Denies sx. States her boyfriend was treated in January with antibiotics.

## 2013-05-06 NOTE — Discharge Instructions (Signed)
You will be contacted if any of your testing is positive.  No unprotected intercourse.  Use a condom to avoid STDs.

## 2013-05-06 NOTE — ED Provider Notes (Signed)
CSN: [id number]     Arrival date & time 05/06/13  0745 History   First MD Initiated Contact with Patient 05/06/13 0800     Chief Complaint  Patient presents with  . SEXUALLY TRANSMITTED DISEASE      HPI  Patient presents with her boyfriend. Daily they had intercourse in January. Both were "sore" afterwards. Apparently the boyfriend went to his physician and was told he did not have an STD but was given a cream and an antibiotic. Now, 6 weeks later, she has become concerned and would like evaluated for STD. She does not have pain sheet. She's not had dyspareunia. She has no discharge or bleeding. No symptoms of pregnancy.  Past Medical History  Diagnosis Date  . PONV (postoperative nausea and vomiting)   . Back pain    Past Surgical History  Procedure Laterality Date  . Knee arthroscopy  Right knee  . Incision and drainage perirectal abscess  02/25/2012    Procedure: IRRIGATION AND DEBRIDEMENT PERIRECTAL ABSCESS;  Surgeon: Dalia HeadingMark A Jenkins, MD;  Location: [facility];  Service: General;  Laterality: N/A;  Fistulotomy   No family history on file. History  Substance Use Topics  . Smoking status: Current Every [place] Smoker -- 0.25 packs/[place]    Types: Cigarettes  . Smokeless tobacco: Not on file  . Alcohol Use: 14.4 oz/week    24 Cans of beer per week     Comment: occasional   OB History   Grav Para Term Preterm Abortions TAB SAB Ect Mult Living                 Review of Systems  Constitutional: Negative for fever, chills, diaphoresis, appetite change and fatigue.  HENT: Negative for mouth sores, sore throat and trouble swallowing.   Eyes: Negative for visual disturbance.  Respiratory: Negative for cough, chest tightness, shortness of breath and wheezing.   Cardiovascular: Negative for chest pain.  Gastrointestinal: Negative for nausea, vomiting, abdominal pain, diarrhea and abdominal distention.  Endocrine: Negative for polydipsia, polyphagia and polyuria.  Genitourinary: Negative for  dysuria, frequency and hematuria.  Musculoskeletal: Negative for gait problem.  Skin: Negative for color change, pallor and rash.  Neurological: Negative for dizziness, syncope, light-headedness and headaches.  Hematological: Does not bruise/bleed easily.  Psychiatric/Behavioral: Negative for behavioral problems and confusion.      Allergies  Review of patient's allergies indicates no known allergies.  Home Medications   Current Outpatient Rx  Name  Route  Sig  Dispense  Refill  . acetaminophen (TYLENOL) 500 MG tablet   Oral   Take 1,000 mg by mouth every 6 (six) hours as needed. pain          . cyclobenzaprine (FLEXERIL) 10 MG tablet   Oral   Take 1 tablet (10 mg total) by mouth 2 (two) times daily as needed for muscle spasms.   20 tablet   0   . cyclobenzaprine (FLEXERIL) 10 MG tablet   Oral   Take 1 tablet (10 mg total) by mouth 3 (three) times daily as needed.   21 tablet   0   . HYDROcodone-acetaminophen (NORCO/VICODIN) 5-325 MG per tablet      Take one-two tabs po q 4-6 hrs prn pain   15 tablet   0   . ibuprofen (ADVIL,MOTRIN) 200 MG tablet   Oral   Take 800 mg by mouth every 6 (six) hours as needed. pain          . predniSONE (DELTASONE) 10  MG tablet   Oral   Take 2 tablets (20 mg total) by mouth 2 (two) times daily.   20 tablet   0   . predniSONE (DELTASONE) 20 MG tablet      Take 3 tablets po qd x 2 days, then 2 tablets po qd x 2 days, then 1 tablet po qd x 2 days   12 tablet   0    BP 137/85  Pulse 120  Temp(Src) 98.1 F (36.7 C)  Resp 18  Ht 5\' 5"  (1.651 m)  Wt 147 lb (66.679 kg)  BMI 24.46 kg/m2  SpO2 98% Physical Exam  Constitutional: She is oriented to person, place, and time. She appears well-developed and well-nourished. No distress.  HENT:  Head: Normocephalic.  Eyes: Conjunctivae are normal. Pupils are equal, round, and reactive to light. No scleral icterus.  Neck: Normal range of motion. Neck supple. No thyromegaly present.    Cardiovascular: Normal rate and regular rhythm.  Exam reveals no gallop and no friction rub.   No murmur heard. Pulmonary/Chest: Effort normal and breath sounds normal. No respiratory distress. She has no wheezes. She has no rales.  Abdominal: Soft. Bowel sounds are normal. She exhibits no distension. There is no tenderness. There is no rebound.  Genitourinary:    Musculoskeletal: Normal range of motion.  Neurological: She is alert and oriented to person, place, and time.  Skin: Skin is warm and dry. No rash noted.  Psychiatric: She has a normal mood and affect. Her behavior is normal.    ED Course  Procedures (including critical care time) Labs Review Labs Reviewed  WET PREP, GENITAL - Abnormal; Notable for the following:    Clue Cells Wet Prep HPF POC FEW (*)    All other components within normal limits  GC/CHLAMYDIA PROBE AMP  PREGNANCY, URINE   Imaging Review No results found.   EKG Interpretation None      MDM   Final diagnoses:  Screening for STD (sexually transmitted disease)    Normal exam. No signs or findings suggestive of STD. GC chlamydia pending.    Rolland Porter, MD 05/06/13 [phone]

## 2013-05-07 LAB — GC/CHLAMYDIA PROBE AMP
CT PROBE, AMP APTIMA: NEGATIVE
GC Probe RNA: NEGATIVE

## 2015-06-29 ENCOUNTER — Emergency Department (HOSPITAL_COMMUNITY)
Admission: EM | Admit: 2015-06-29 | Discharge: 2015-06-29 | Disposition: A | Discharge status: Home / Self Care | Payer: Medicaid - Out of State | Attending: Emergency Medicine | Admitting: Emergency Medicine

## 2015-06-29 ENCOUNTER — Encounter (HOSPITAL_COMMUNITY): Payer: Self-pay | Admitting: Emergency Medicine

## 2015-06-29 DIAGNOSIS — N760 Acute vaginitis: Secondary | ICD-10-CM | POA: Insufficient documentation

## 2015-06-29 DIAGNOSIS — F17210 Nicotine dependence, cigarettes, uncomplicated: Secondary | ICD-10-CM | POA: Diagnosis not present

## 2015-06-29 DIAGNOSIS — N939 Abnormal uterine and vaginal bleeding, unspecified: Secondary | ICD-10-CM

## 2015-06-29 DIAGNOSIS — B9689 Other specified bacterial agents as the cause of diseases classified elsewhere: Secondary | ICD-10-CM

## 2015-06-29 DIAGNOSIS — N938 Other specified abnormal uterine and vaginal bleeding: Secondary | ICD-10-CM | POA: Diagnosis present

## 2015-06-29 LAB — BASIC METABOLIC PANEL
Anion gap: 9 (ref 5–15)
BUN: 20 mg/dL (ref 6–20)
CALCIUM: 9.8 mg/dL (ref 8.9–10.3)
CO2: 28 mmol/L (ref 22–32)
CREATININE: 0.99 mg/dL (ref 0.44–1.00)
Chloride: 103 mmol/L (ref 101–111)
GFR calc Af Amer: >60 mL/min (ref >60)
GLUCOSE: 93 mg/dL (ref 65–99)
Potassium: 4.3 mmol/L (ref 3.5–5.1)
Sodium: 140 mmol/L (ref 135–145)

## 2015-06-29 LAB — WET PREP, GENITAL
Sperm: NONE SEEN
Trich, Wet Prep: NONE SEEN
Yeast Wet Prep HPF POC: NONE SEEN

## 2015-06-29 LAB — URINE MICROSCOPIC-ADD ON

## 2015-06-29 LAB — I-STAT CHEM 8, ED
BUN: 25 mg/dL — ABNORMAL HIGH (ref 6–20)
CREATININE: 0.9 mg/dL (ref 0.44–1.00)
Calcium, Ion: 0.83 mmol/L — ABNORMAL LOW (ref 1.12–1.23)
Chloride: 108 mmol/L (ref 101–111)
Glucose, Bld: 92 mg/dL (ref 65–99)
HEMATOCRIT: 41 % (ref 36.0–46.0)
HEMOGLOBIN: 13.9 g/dL (ref 12.0–15.0)
POTASSIUM: 6.2 mmol/L — AB (ref 3.5–5.1)
SODIUM: 135 mmol/L (ref 135–145)
TCO2: 22 mmol/L (ref 0–100)

## 2015-06-29 LAB — URINALYSIS, ROUTINE W REFLEX MICROSCOPIC
BILIRUBIN URINE: NEGATIVE
Glucose, UA: NEGATIVE mg/dL
KETONES UR: NEGATIVE mg/dL
LEUKOCYTES UA: NEGATIVE
NITRITE: NEGATIVE
PH: 7.5 (ref 5.0–8.0)
PROTEIN: NEGATIVE mg/dL
Specific Gravity, Urine: 1.02 (ref 1.005–1.030)

## 2015-06-29 MED ORDER — METRONIDAZOLE 500 MG PO TABS: 500.0000 mg | ORAL_TABLET | Freq: Two times a day (BID) | ORAL | Status: DC

## 2015-06-29 MED ORDER — MEDROXYPROGESTERONE ACETATE 10 MG PO TABS: 10.0000 mg | ORAL_TABLET | Freq: Every day | ORAL | Status: DC

## 2015-06-29 MED ORDER — MEDROXYPROGESTERONE ACETATE 10 MG PO TABS
10.0000 mg | ORAL_TABLET | Freq: Every day | ORAL | Status: DC
  Administered 2015-06-29: 10 mg via ORAL
  Filled 2015-06-29 (×2): qty 1

## 2015-06-29 MED ORDER — METRONIDAZOLE 500 MG PO TABS
500.0000 mg | ORAL_TABLET | Freq: Once | ORAL | Status: AC
  Administered 2015-06-29: 500 mg via ORAL
  Filled 2015-06-29: qty 1

## 2015-06-29 NOTE — ED Notes (Signed)
Patient c/o vaginal bleeding. Per patient Had tubal ligation April 11th with spotting afterwards, menstrual cycle started, stopped, and now has had bleeding x2 weeks. Denies being symptomatic-Denies any dizziness or weakness.

## 2015-06-29 NOTE — ED Provider Notes (Signed)
CSN: [id number]     Arrival date & time 06/29/15  1418 History   First MD Initiated Contact with Patient 06/29/15 1427     Chief Complaint  Patient presents with  . Vaginal Bleeding     (Consider location/radiation/quality/duration/timing/severity/associated sxs/prior Treatment) Patient is a 30 y.o. female presenting with vaginal bleeding. The history is provided by the patient.  Vaginal Bleeding Quality:  Bright red Severity:  Mild Onset quality:  Gradual Duration:  2 weeks Timing:  Constant Progression:  Improving Chronicity:  Recurrent Menstrual history:  Irregular Relieved by:  None tried Worsened by:  Nothing tried Ineffective treatments:  None tried Associated symptoms: no abdominal pain, no back pain, no dysuria and no fever     Past Medical History  Diagnosis Date  . PONV (postoperative nausea and vomiting)   . Back pain    Past Surgical History  Procedure Laterality Date  . Knee arthroscopy  Right knee  . Incision and drainage perirectal abscess  02/25/2012    Procedure: IRRIGATION AND DEBRIDEMENT PERIRECTAL ABSCESS;  Surgeon: Dalia Heading, MD;  Location: [facility];  Service: General;  Laterality: N/A;  Fistulotomy  . Tubal ligation     History reviewed. No pertinent family history. Social History  Substance Use Topics  . Smoking status: Current Every [place] Smoker -- 0.25 packs/[place]    Types: Cigarettes  . Smokeless tobacco: Never Used  . Alcohol Use: 14.4 oz/week    24 Cans of beer per week     Comment: occasional   OB History    Gravida Para Term Preterm AB TAB SAB Ectopic Multiple Living   Review of Systems  Constitutional: Negative for fever.  HENT: Negative for congestion and facial swelling.   Eyes: Negative for discharge and redness.  Respiratory: Negative for cough and shortness of breath.   Cardiovascular: Negative for chest pain.  Gastrointestinal: Negative for abdominal pain and abdominal distention.  Endocrine: Negative for  polydipsia.  Genitourinary: Positive for vaginal bleeding. Negative for dysuria.  Musculoskeletal: Negative for back pain.  Skin: Negative for wound.  Neurological: Negative for headaches.  All other systems reviewed and are negative.     Allergies  Review of patient's allergies indicates no known allergies.  Home Medications   Prior to Admission medications   Medication Sig Start Date End Date Taking? Authorizing Provider  cyclobenzaprine (FLEXERIL) 10 MG tablet Take 1 tablet (10 mg total) by mouth 2 (two) times daily as needed for muscle spasms. 07/24/12   Hope Orlene Och, NP  cyclobenzaprine (FLEXERIL) 10 MG tablet Take 1 tablet (10 mg total) by mouth 3 (three) times daily as needed. 03/31/13   Tammy Triplett, PA-C  HYDROcodone-acetaminophen (NORCO/VICODIN) 5-325 MG per tablet Take one-two tabs po q 4-6 hrs prn pain 03/31/13   Tammy Triplett, PA-C  medroxyPROGESTERone (PROVERA) 10 MG tablet Take 1 tablet (10 mg total) by mouth daily. Until gone 06/30/15   Marily Memos, MD  metroNIDAZOLE (FLAGYL) 500 MG tablet Take 1 tablet (500 mg total) by mouth 2 (two) times daily. One po bid x 7 days 06/29/15   Marily Memos, MD  predniSONE (DELTASONE) 10 MG tablet Take 2 tablets (20 mg total) by mouth 2 (two) times daily. 07/24/12   Hope Orlene Och, NP  predniSONE (DELTASONE) 20 MG tablet Take 3 tablets po qd x 2 days, then 2 tablets po qd x 2 days, then 1 tablet po qd x 2  days 03/31/13   Tammy Triplett, PA-C   BP 122/70 mmHg  Pulse 89  Temp(Src) 98 F (36.7 C) (Oral)  Resp 18  Ht 5\' 4"  (1.626 m)  Wt 159 lb 9.6 oz (72.394 kg)  BMI 27.38 kg/m2  SpO2 100% Physical Exam  Constitutional: She appears well-developed and well-nourished.  HENT:  Head: Normocephalic and atraumatic.  Neck: Normal range of motion.  Cardiovascular: Normal rate and regular rhythm.   Pulmonary/Chest: No stridor. No respiratory distress.  Abdominal: She exhibits no distension.  Genitourinary: Cervix exhibits friability. Cervix exhibits  no discharge.  Neurological: She is alert.  Nursing note and vitals reviewed.   ED Course  Procedures (including critical care time) Labs Review Labs Reviewed  WET PREP, GENITAL - Abnormal; Notable for the following:    Clue Cells Wet Prep HPF POC PRESENT (*)    WBC, Wet Prep HPF POC FEW (*)    All other components within normal limits  URINALYSIS, ROUTINE W REFLEX MICROSCOPIC (NOT AT [facility]) - Abnormal; Notable for the following:    Hgb urine dipstick SMALL (*)    All other components within normal limits  URINE MICROSCOPIC-ADD ON - Abnormal; Notable for the following:    Squamous Epithelial / LPF 6-30 (*)    Bacteria, UA FEW (*)    All other components within normal limits  I-STAT CHEM 8, ED - Abnormal; Notable for the following:    Potassium 6.2 (*)    BUN 25 (*)    Calcium, Ion 0.83 (*)    All other components within normal limits  BASIC METABOLIC PANEL  GC/CHLAMYDIA PROBE AMP ([place]) NOT AT [facility]    Imaging Review No results found. I have personally reviewed and evaluated these images and lab results as part of my medical decision-making.   EKG Interpretation None      MDM   Final diagnoses:  Bacterial vaginosis  Vaginal bleeding    Vaginal bleeding and BV. Flagyl adn provera provided. No low hemoglobin or symptoms to suggest acute anemia. Will fu w/ pcp.   New Prescriptions: Discharge Medication List as of 06/29/2015  5:16 PM    START taking these medications   Details  medroxyPROGESTERone (PROVERA) 10 MG tablet Take 1 tablet (10 mg total) by mouth daily. Until gone, Starting 06/30/2015, Until Discontinued, Print    metroNIDAZOLE (FLAGYL) 500 MG tablet Take 1 tablet (500 mg total) by mouth 2 (two) times daily. One po bid x 7 days, Starting 06/29/2015, Until Discontinued, Print         I have personally and contemperaneously reviewed labs and imaging and used in my decision making as above.   A medical screening exam was performed and I feel the patient  has had an appropriate workup for their chief complaint at this time and likelihood of emergent condition existing is low and thus workup can continue on an outpatient basis.. Their vital signs are stable. They have been counseled on decision, discharge, follow up and which symptoms necessitate immediate return to the emergency department.  They verbally stated understanding and agreement with plan and discharged in stable condition.      Marily MemosJason Wylee Dorantes, MD 06/29/15 [phone]

## 2015-06-30 LAB — GC/CHLAMYDIA PROBE AMP (~~LOC~~) NOT AT ARMC
Chlamydia: POSITIVE — AB
NEISSERIA GONORRHEA: NEGATIVE

## 2015-07-01 ENCOUNTER — Telehealth (HOSPITAL_BASED_OUTPATIENT_CLINIC_OR_DEPARTMENT_OTHER): Payer: Self-pay | Admitting: Emergency Medicine

## 2015-07-01 NOTE — Telephone Encounter (Signed)
Chart handoff to EDP for treatment plan for + Chlamydia 

## 2015-07-02 ENCOUNTER — Telehealth (HOSPITAL_BASED_OUTPATIENT_CLINIC_OR_DEPARTMENT_OTHER): Payer: Self-pay | Admitting: Emergency Medicine

## 2015-07-02 NOTE — Telephone Encounter (Signed)
Post ED Visit - Positive Culture Follow-up: Successful Patient Follow-Up  Culture assessed and recommendations reviewed by: []  Enzo BiNathan Batchelder, Pharm.D. []  Celedonio MiyamotoJeremy Frens, Pharm.D., BCPS []  Garvin FilaMike Maccia, Pharm.D. []  Georgina PillionElizabeth Martin, Pharm.D., BCPS []  [place]h Pham, [place]D., BCPS, AAHIVP []  Estella HuskMichelle Turner, Pharm.D., BCPS, AAHIVP []  Tennis Mustassie Stewart, Pharm.D. []  Rob Soda SpringsVincent, [place]arm.D.  Positive chlamydia culture  [x]  Patient discharged without antimicrobial prescription and treatment is now indicated []  Organism is resistant to prescribed ED discharge antimicrobial []  Patient with positive blood cultures  Changes discussed with ED provider: Mancel BaleElliott Wentz MD New antibiotic prescription Zithromax 1000 mg po x 1 [facility] [place]  Contacted patient, 07/02/15 1118   Susan MullMiller, Susan Greene 07/02/2015, 11:15 AM

## 2016-01-04 ENCOUNTER — Emergency Department (HOSPITAL_COMMUNITY)
Admission: EM | Admit: 2016-01-04 | Discharge: 2016-01-04 | Disposition: A | Discharge status: Home / Self Care | Payer: Medicaid - Out of State | Attending: Emergency Medicine | Admitting: Emergency Medicine

## 2016-01-04 ENCOUNTER — Encounter (HOSPITAL_COMMUNITY): Payer: Self-pay | Admitting: *Deleted

## 2016-01-04 DIAGNOSIS — Z79899 Other long term (current) drug therapy: Secondary | ICD-10-CM | POA: Insufficient documentation

## 2016-01-04 DIAGNOSIS — F17210 Nicotine dependence, cigarettes, uncomplicated: Secondary | ICD-10-CM | POA: Insufficient documentation

## 2016-01-04 DIAGNOSIS — M545 Low back pain, unspecified: Secondary | ICD-10-CM

## 2016-01-04 DIAGNOSIS — M542 Cervicalgia: Secondary | ICD-10-CM | POA: Insufficient documentation

## 2016-01-04 LAB — URINALYSIS, ROUTINE W REFLEX MICROSCOPIC
BILIRUBIN URINE: NEGATIVE
Glucose, UA: NEGATIVE mg/dL
HGB URINE DIPSTICK: NEGATIVE
Ketones, ur: NEGATIVE mg/dL
Leukocytes, UA: NEGATIVE
Nitrite: NEGATIVE
PROTEIN: NEGATIVE mg/dL
SPECIFIC GRAVITY, URINE: 1.015 (ref 1.005–1.030)
pH: 8 (ref 5.0–8.0)

## 2016-01-04 LAB — PREGNANCY, URINE: PREG TEST UR: NEGATIVE

## 2016-01-04 MED ORDER — DICLOFENAC SODIUM 50 MG PO TBEC: 50.0000 mg | DELAYED_RELEASE_TABLET | Freq: Two times a day (BID) | ORAL | 0 refills | Status: DC

## 2016-01-04 MED ORDER — METHOCARBAMOL 500 MG PO TABS: 500.0000 mg | ORAL_TABLET | Freq: Two times a day (BID) | ORAL | 0 refills | Status: DC

## 2016-01-04 NOTE — ED Provider Notes (Signed)
[facility] Provider Note   CSN: [id number] Arrival date & time: 01/04/16  1231  By signing my name below, I, Rosario AdieWilliam Andrew Hiatt, attest that this documentation has been prepared under the direction and in the presence of Ok EdwardsLeslie Karen Ashleyann Shoun, PA-C.  Electronically Signed: Rosario AdieWilliam Andrew Hiatt, ED Scribe. 01/04/16. 1:41 PM.  History   Chief Complaint Chief Complaint  Patient presents with  . Back Pain   The history is provided by the patient and medical records. No language interpreter was used.   HPI Comments: Susan Greene is a 30 y.o. female who presents to the Emergency Department complaining of gradually worsening, acute on chronic lower back pain onset approximately 2-3 days ago. Her pain radiate will occasionally radiate into upwards into her neck, and she additionally notes that her hands will intermittently become numb and tingle with her radiation of pain. Pt has a h/o similar back pain, however, states that it has never been accompanied by numbness/arasteasias. No recent trauma, falls, heavy lifting, or injury to the back. She states that she does do repetitive motions with her hands for work. No h/o DM or HTN. Denies fever, chills, extremity pain, saddle anaesthesia/paraesthesias, or any other associated symptoms.  Past Medical History:  Diagnosis Date  . Back pain   . PONV (postoperative nausea and vomiting)    There are no active problems to display for this patient.  Past Surgical History:  Procedure Laterality Date  . INCISION AND DRAINAGE PERIRECTAL ABSCESS  02/25/2012   Procedure: IRRIGATION AND DEBRIDEMENT PERIRECTAL ABSCESS;  Surgeon: Dalia HeadingMark A Jenkins, MD;  Location: [facility];  Service: General;  Laterality: N/A;  Fistulotomy  . KNEE ARTHROSCOPY  Right knee  . TUBAL LIGATION     OB History    Gravida Para Term Preterm AB Living   1 1 1     1    SAB TAB Ectopic Multiple Live Births                 Home Medications    Prior to Admission medications     Medication Sig Start Date End Date Taking? Authorizing Provider  cyclobenzaprine (FLEXERIL) 10 MG tablet Take 1 tablet (10 mg total) by mouth 2 (two) times daily as needed for muscle spasms. 07/24/12   Hope Orlene OchM Neese, NP  cyclobenzaprine (FLEXERIL) 10 MG tablet Take 1 tablet (10 mg total) by mouth 3 (three) times daily as needed. 03/31/13   Tammy Triplett, PA-C  diclofenac (VOLTAREN) 50 MG EC tablet Take 1 tablet (50 mg total) by mouth 2 (two) times daily. 01/04/16   Elson AreasLeslie K Lailanie Hasley, PA-C  HYDROcodone-acetaminophen (NORCO/VICODIN) 5-325 MG per tablet Take one-two tabs po q 4-6 hrs prn pain 03/31/13   Tammy Triplett, PA-C  medroxyPROGESTERone (PROVERA) 10 MG tablet Take 1 tablet (10 mg total) by mouth daily. Until gone 06/30/15   Marily MemosJason Mesner, MD  methocarbamol (ROBAXIN) 500 MG tablet Take 1 tablet (500 mg total) by mouth 2 (two) times daily. 01/04/16   Elson AreasLeslie K Chantae Soo, PA-C  metroNIDAZOLE (FLAGYL) 500 MG tablet Take 1 tablet (500 mg total) by mouth 2 (two) times daily. One po bid x 7 days 06/29/15   Marily MemosJason Mesner, MD  predniSONE (DELTASONE) 10 MG tablet Take 2 tablets (20 mg total) by mouth 2 (two) times daily. 07/24/12   Hope Orlene OchM Neese, NP  predniSONE (DELTASONE) 20 MG tablet Take 3 tablets po qd x 2 days, then 2 tablets po qd x 2 days, then 1 tablet po qd x  2 days 03/31/13   Pauline Aus, PA-C   Family History No family history on file.  Social History Social History  Substance Use Topics  . Smoking status: Current Every [place] Smoker    Types: Cigarettes  . Smokeless tobacco: Never Used     Comment: 1/2 pack of cigarettes per week   . Alcohol use Yes     Comment: couple beers on the weekend   Allergies   Patient has no known allergies.  Review of Systems Review of Systems  Constitutional: Negative for chills and fever.  Musculoskeletal: Positive for arthralgias, back pain, myalgias and neck pain.  Neurological: Positive for numbness.  All other systems reviewed and are negative.  Physical  Exam Updated Vital Signs BP 125/59 (BP Location: Left Arm)   Pulse 76   Temp 97.7 F (36.5 C) (Oral)   Resp 18   Ht 5\' 5"  (1.651 m)   Wt 158 lb (71.7 kg)   LMP 12/21/2015   SpO2 100%   BMI 26.29 kg/m   Physical Exam  Constitutional: She appears well-developed and well-nourished. No distress.  HENT:  Head: Normocephalic and atraumatic.  Eyes: Conjunctivae are normal.  Neck: Normal range of motion. Neck supple.  Cardiovascular: Normal rate.   Pulmonary/Chest: Effort normal.  Abdominal: She exhibits no distension.  Musculoskeletal: Normal range of motion. She exhibits tenderness.  Diffusely tenderness involving the lumbar spine. Tender paravertebral area. C-spine is otherwise non-tender. Normal ROM with hands and wrist. Neurovascularly intact.   Neurological: She is alert.  Skin: No pallor.  Psychiatric: She has a normal mood and affect. Her behavior is normal.  Nursing note and vitals reviewed.  ED Treatments / Results  DIAGNOSTIC STUDIES: Oxygen Saturation is 100% on RA, normal by my interpretation.   COORDINATION OF CARE: 1:41 PM-Discussed next steps with pt. Pt verbalized understanding and is agreeable with the plan.   Procedures Procedures   Medications Ordered in ED Medications - No data to display  Initial Impression / Assessment and Plan / ED Course  I have reviewed the triage vital signs and the nursing notes.  Pertinent labs & imaging results that were available during my care of the patient were reviewed by me and considered in my medical decision making (see chart for details).  Clinical Course    Patient is a 30yo female with a hx of back pain, who presents to the ED with lower back pain. No neurological deficits appreciated. Patient is ambulatory. No warning symptoms of back pain including: fecal incontinence, urinary retention or overflow incontinence, night sweats, waking from sleep with back pain, unexplained fevers or weight loss, h/o cancer, recent  trauma. No concern for cauda equina, epidural abscess, or other serious cause of back pain. Conservative measures such as rest, ice/heat and pain medicine indicated with PCP follow-up if no improvement with conservative management. Will also rx Robaxin and Voltaren. Pt is comfortable with above plan and is stable for discharge at this time. All questions were answered prior to disposition. Strict return precautions for return into the ED were discussed.   Final Clinical Impressions(s) / ED Diagnoses   Final diagnoses:  Acute midline low back pain without sciatica   New Prescriptions New Prescriptions   DICLOFENAC (VOLTAREN) 50 MG EC TABLET    Take 1 tablet (50 mg total) by mouth 2 (two) times daily.   METHOCARBAMOL (ROBAXIN) 500 MG TABLET    Take 1 tablet (500 mg total) by mouth 2 (two) times daily.    I  personally performed the services in this documentation, which was scribed in my presence.  The recorded information has been reviewed and considered.   Barnet PallKaren SofiaPAC. An After Visit Summary was printed and given to the patient.   Lonia SkinnerLeslie K Garden PlainSofia, PA-C 01/04/16 1409    Azalia BilisKevin Campos, MD 01/04/16 [phone]

## 2016-01-04 NOTE — ED Triage Notes (Signed)
Pt reports lower back pain that radiates up the spine and down into her fingers making her hands feel numb that started a few days ago. No known injury.

## 2016-01-04 NOTE — ED Notes (Signed)
Pt with lower back pain since Friday, pt states that she does a lot of bending at work.  Pain radiates up back and c/o fingers/hands go numb.  C/o finger tips with tingling and started on Friday as well.

## 2016-01-04 NOTE — Discharge Instructions (Signed)
Schedule appointment for a primary care MD.

## 2016-01-06 ENCOUNTER — Encounter (HOSPITAL_COMMUNITY): Payer: Self-pay

## 2016-01-06 ENCOUNTER — Emergency Department (HOSPITAL_COMMUNITY)
Admission: EM | Admit: 2016-01-06 | Discharge: 2016-01-06 | Disposition: A | Discharge status: Home / Self Care | Payer: Medicaid - Out of State | Attending: Emergency Medicine | Admitting: Emergency Medicine

## 2016-01-06 DIAGNOSIS — Z79899 Other long term (current) drug therapy: Secondary | ICD-10-CM | POA: Insufficient documentation

## 2016-01-06 DIAGNOSIS — S3992XS Unspecified injury of lower back, sequela: Secondary | ICD-10-CM | POA: Diagnosis present

## 2016-01-06 DIAGNOSIS — S39012S Strain of muscle, fascia and tendon of lower back, sequela: Secondary | ICD-10-CM

## 2016-01-06 DIAGNOSIS — X58XXXS Exposure to other specified factors, sequela: Secondary | ICD-10-CM | POA: Insufficient documentation

## 2016-01-06 DIAGNOSIS — F17210 Nicotine dependence, cigarettes, uncomplicated: Secondary | ICD-10-CM | POA: Diagnosis not present

## 2016-01-06 MED ORDER — IBUPROFEN 800 MG PO TABS
800.0000 mg | ORAL_TABLET | Freq: Once | ORAL | Status: AC
  Administered 2016-01-06: 800 mg via ORAL
  Filled 2016-01-06: qty 1

## 2016-01-06 MED ORDER — ONDANSETRON HCL 4 MG PO TABS
4.0000 mg | ORAL_TABLET | Freq: Once | ORAL | Status: AC
  Administered 2016-01-06: 4 mg via ORAL
  Filled 2016-01-06: qty 1

## 2016-01-06 MED ORDER — DIAZEPAM 5 MG PO TABS
10.0000 mg | ORAL_TABLET | Freq: Once | ORAL | Status: AC
  Administered 2016-01-06: 10 mg via ORAL
  Filled 2016-01-06: qty 2

## 2016-01-06 MED ORDER — CYCLOBENZAPRINE HCL 10 MG PO TABS: 10.0000 mg | ORAL_TABLET | Freq: Three times a day (TID) | ORAL | 0 refills | Status: DC

## 2016-01-06 NOTE — Discharge Instructions (Signed)
Your vital signs are within normal limits. Your oxygen level is 98% on room air. I reviewed your previous emergency department visit this week. Your examination at this time suggest muscle strain involving the lower back area. Please use a heating pad to the area. Use Flexeril 3 times daily.This medication may cause drowsiness. Please do not drink, drive, or participate in activity that requires concentration while taking this medication. Continue your diclofenac 2 times daily. Please rest your back is much as possible. Please see your orthopedic specialist and fidgeting, or Dr. Eulah PontMurphy for orthopedic evaluation of your recurrent back problems.

## 2016-01-06 NOTE — ED Provider Notes (Signed)
[facility] Provider Note   CSN: [id number] Arrival date & time: 01/06/16  1905     History   Chief Complaint Chief Complaint  Patient presents with  . Back Pain    HPI Susan Greene is a 30 y.o. female.  Patient is a 30 year old female who presents to the emergency department with a complaint of lower back pain. The patient states that she has pain in the lower back. This problem started actually last week. She complains that at times she has some numbing sensation in both hands. It is of note that she was seen on November 12 with similar symptoms. She was treated with Robaxin and diclofenac. The patient states she has returned to work and is continuing to have problems at work. She does admit however that she does a lot of bending, and a lot of movement involving her back and lower extremities. There's been no recent operations or procedures. The patient has had some problems with her back over the last couple of years. She states that she had an MRI in January approximately a year ago. She states that there was nothing specific found at that time. She presents now because she states that she cannot do her job without pain. There's been no loss of bowel or bladder function. There's been no high fever. There's been no frequent falls. There's been no loss of control of any of the upper or lower extremities.   The history is provided by the patient.  Back Pain      Past Medical History:  Diagnosis Date  . Back pain   . PONV (postoperative nausea and vomiting)     There are no active problems to display for this patient.   Past Surgical History:  Procedure Laterality Date  . INCISION AND DRAINAGE PERIRECTAL ABSCESS  02/25/2012   Procedure: IRRIGATION AND DEBRIDEMENT PERIRECTAL ABSCESS;  Surgeon: Dalia Heading, MD;  Location: [facility];  Service: General;  Laterality: N/A;  Fistulotomy  . KNEE ARTHROSCOPY  Right knee  . TUBAL LIGATION      OB History    Gravida Para Term  Preterm AB Living   1 1 1     1    SAB TAB Ectopic Multiple Live Births                   Home Medications    Prior to Admission medications   Medication Sig Start Date End Date Taking? Authorizing Provider  cyclobenzaprine (FLEXERIL) 10 MG tablet Take 1 tablet (10 mg total) by mouth 3 (three) times daily. 01/06/16   Ivery Quale, PA-C  diclofenac (VOLTAREN) 50 MG EC tablet Take 1 tablet (50 mg total) by mouth 2 (two) times daily. 01/04/16   Elson Areas, PA-C  HYDROcodone-acetaminophen (NORCO/VICODIN) 5-325 MG per tablet Take one-two tabs po q 4-6 hrs prn pain 03/31/13   Tammy Triplett, PA-C  medroxyPROGESTERone (PROVERA) 10 MG tablet Take 1 tablet (10 mg total) by mouth daily. Until gone 06/30/15   Marily Memos, MD  methocarbamol (ROBAXIN) 500 MG tablet Take 1 tablet (500 mg total) by mouth 2 (two) times daily. 01/04/16   Elson Areas, PA-C  metroNIDAZOLE (FLAGYL) 500 MG tablet Take 1 tablet (500 mg total) by mouth 2 (two) times daily. One po bid x 7 days 06/29/15   Marily Memos, MD  predniSONE (DELTASONE) 10 MG tablet Take 2 tablets (20 mg total) by mouth 2 (two) times daily. 07/24/12   Hope Orlene Och, NP  predniSONE (DELTASONE) 20 MG tablet Take 3 tablets po qd x 2 days, then 2 tablets po qd x 2 days, then 1 tablet po qd x 2 days 03/31/13   Pauline Ausammy Triplett, PA-C    Family History No family history on file.  Social History Social History  Substance Use Topics  . Smoking status: Current Every [place] Smoker    Types: Cigarettes  . Smokeless tobacco: Never Used     Comment: 1/2 pack of cigarettes per week   . Alcohol use Yes     Comment: couple beers on the weekend     Allergies   Patient has no known allergies.   Review of Systems Review of Systems  Musculoskeletal: Positive for back pain and neck pain.  All other systems reviewed and are negative.    Physical Exam Updated Vital Signs BP 116/69 (BP Location: Left Arm)   Pulse 95   Temp 98.4 F (36.9 C) (Oral)   Resp 16    Ht 5\' 5"  (1.651 m)   Wt 71.7 kg   LMP 12/21/2015   SpO2 98%   BMI 26.29 kg/m   Physical Exam  Constitutional: She is oriented to person, place, and time. She appears well-developed and well-nourished.  Non-toxic appearance.  HENT:  Head: Normocephalic.  Right Ear: Tympanic membrane and external ear normal.  Left Ear: Tympanic membrane and external ear normal.  Eyes: EOM and lids are normal. Pupils are equal, round, and reactive to light.  Neck: Normal range of motion. Neck supple. Carotid bruit is not present.  Cardiovascular: Normal rate, regular rhythm, normal heart sounds, intact distal pulses and normal pulses.   Pulmonary/Chest: Breath sounds normal. No respiratory distress.  Abdominal: Soft. Bowel sounds are normal. There is no tenderness. There is no guarding.  Musculoskeletal: Normal range of motion.       Lumbar back: She exhibits pain and spasm. She exhibits normal range of motion.  There is tenderness in the lower lumbar paraspinal region. Pain is easily reproduced with attempted range of motion. No hot areas appreciated. No palpable step off of the lumbar spine area.  Lymphadenopathy:       Head (right side): No submandibular adenopathy present.       Head (left side): No submandibular adenopathy present.    She has no cervical adenopathy.  Neurological: She is alert and oriented to person, place, and time. She has normal strength. No cranial nerve deficit or sensory deficit. Coordination and gait normal.  Skin: Skin is warm and dry.  Psychiatric: She has a normal mood and affect. Her speech is normal.  Nursing note and vitals reviewed.    ED Treatments / Results  Labs (all labs ordered are listed, but only abnormal results are displayed) Labs Reviewed - No data to display  EKG  EKG Interpretation None       Radiology No results found.  Procedures Procedures (including critical care time)  Medications Ordered in ED Medications  diazepam (VALIUM) tablet  10 mg (not administered)  ibuprofen (ADVIL,MOTRIN) tablet 800 mg (not administered)  ondansetron (ZOFRAN) tablet 4 mg (not administered)     Initial Impression / Assessment and Plan / ED Course  I have reviewed the triage vital signs and the nursing notes.  Pertinent labs & imaging results that were available during my care of the patient were reviewed by me and considered in my medical decision making (see chart for details).  Clinical Course     **I have reviewed nursing notes, vital  signs, and all appropriate lab and imaging results for this patient.*  Final Clinical Impressions(s) / ED Diagnoses  Vital signs within normal limits. There no gross neurologic deficits appreciated at this time. There is no evidence for caudal equina, or other emergent changes involving the lower back. There no vital sign changes that would suggest major infection. There's been no recent trauma, operations, or procedures involving the back. I reviewed the patient's previous records, and there's been some complaint of back related problem for quite some time. There's been some documentation as far back as 2014. The patient states that she had an MRI by an orthopedic specialist in [place] 1 or 2 years ago, and there was no significant findings. I've advised the patient to see an orthopedic specialist again either here in the [place] area, or in [place] on. I've asked the patient to hold the Robaxin at this time, and use the Flexeril. I've warned her that the Flexeril may cause drowsiness. I warned her not to drink alcohol, operate machinery, drive a vehicle, or participate in activities requiring concentration. The patient will continue the diclofenac. I've also asked the patient to add heat to her treatment regimen. The patient knowledge is understanding of these discharge instructions.    Final diagnoses:  Strain of lumbar region, sequela    New Prescriptions New Prescriptions   CYCLOBENZAPRINE  (FLEXERIL) 10 MG TABLET    Take 1 tablet (10 mg total) by mouth 3 (three) times daily.     Ivery QualeHobson Paxon Propes, PA-C 01/06/16 2059    Benjiman CoreNathan Pickering, MD 01/07/16 [id number]

## 2016-01-06 NOTE — ED Triage Notes (Signed)
Patient states that she is having lower back pain that started last week.  Having numbness and tingling in both hands.  No known injury.  Was seen 2 days ago for the same.

## 2017-10-01 ENCOUNTER — Emergency Department (HOSPITAL_COMMUNITY)
Admission: EM | Admit: 2017-10-01 | Discharge: 2017-10-01 | Disposition: A | Discharge status: Home / Self Care | Payer: Medicaid - Out of State | Attending: Emergency Medicine | Admitting: Emergency Medicine

## 2017-10-01 ENCOUNTER — Encounter (HOSPITAL_COMMUNITY): Payer: Self-pay | Admitting: *Deleted

## 2017-10-01 ENCOUNTER — Other Ambulatory Visit: Payer: Self-pay

## 2017-10-01 DIAGNOSIS — K625 Hemorrhage of anus and rectum: Secondary | ICD-10-CM

## 2017-10-01 DIAGNOSIS — F17210 Nicotine dependence, cigarettes, uncomplicated: Secondary | ICD-10-CM | POA: Insufficient documentation

## 2017-10-01 NOTE — ED Triage Notes (Addendum)
Pt c/o "gel like substance" from abscess to rectal area while using the restroom, pt also states that she notices blood when she wipes after having a bowel movement and wants to get "checked for everything"

## 2017-10-01 NOTE — ED Provider Notes (Signed)
[facility] Provider Note   CSN: [id number] Arrival date & time: 10/01/17  2227     History   Chief Complaint Chief Complaint  Patient presents with  . Rectal Bleeding    HPI Susan Greene is a 32 y.o. female.  The history is provided by the patient.  Rectal Bleeding  Amount:  Scant Timing:  Intermittent Chronicity:  New Similar prior episodes: yes   Relieved by:  None tried Worsened by:  Wiping Associated symptoms: abdominal pain   Associated symptoms: no fever and no vomiting   Patient presents with multiple complaints. She reports for a long time she has had small amount of blood on toilet paper when she wipes after bowel movement.  She is unable to tell me when this actually started. She reports today after having a bowel movement she noticed a gel-like substance from her rectal area.  Denies any pain with defecation.  No recent anal trauma.  She does have previous history of anal intercourse but none recently. Reports very mild epigastric pain that started yesterday.  No lower abdominal pain. She denies any actual rectal bleeding, she only sees small amount of blood on toilet paper She has history of I&D of perirectal abscess as well as fistulotomy in 2014 No Recent evaluation by surgery. She is requesting to be tested for "everything" including herpes and AIDS but no known exposures were reported  Past Medical History:  Diagnosis Date  . Back pain   . PONV (postoperative nausea and vomiting)     There are no active problems to display for this patient.   Past Surgical History:  Procedure Laterality Date  . INCISION AND DRAINAGE PERIRECTAL ABSCESS  02/25/2012   Procedure: IRRIGATION AND DEBRIDEMENT PERIRECTAL ABSCESS;  Surgeon: Dalia HeadingMark A Jenkins, MD;  Location: [facility];  Service: General;  Laterality: N/A;  Fistulotomy  . KNEE ARTHROSCOPY  Right knee  . TUBAL LIGATION       OB History    Gravida  1   Para  1   Term  1   Preterm      AB      Living  1     SAB      TAB      Ectopic      Multiple      Live Births               Home Medications    Prior to Admission medications   Not on File    Family History No family history on file.  Social History Social History   Tobacco Use  . Smoking status: Current Every [place] Smoker    Types: Cigarettes  . Smokeless tobacco: Never Used  . Tobacco comment: 1/2 pack of cigarettes per week   Substance Use Topics  . Alcohol use: Yes    Comment: couple beers on the weekend  . Drug use: No     Allergies   Patient has no known allergies.   Review of Systems Review of Systems  Constitutional: Negative for fever.  Gastrointestinal: Positive for abdominal pain and hematochezia. Negative for vomiting.  Genitourinary: Negative for dysuria and vaginal bleeding.  All other systems reviewed and are negative.    Physical Exam Updated Vital Signs BP (!) 147/92 (BP Location: Right Arm)   Pulse (!) 118   Temp 98.2 F (36.8 C) (Oral)   Resp 18   Ht 1.651 m (5\' 5" )   Wt 71.7 kg  LMP 09/18/2017   SpO2 98%   BMI 26.29 kg/m   Physical Exam CONSTITUTIONAL: Well developed/well nourished HEAD: Normocephalic/atraumatic EYES: EOMI ENMT: Mucous membranes moist NECK: supple no meningeal signs SPINE/BACK:entire spine nontender CV: S1/S2 noted, no murmurs/rubs/gallops noted LUNGS: Lungs are clear to auscultation bilaterally, no apparent distress ABDOMEN: soft, nontender, no rebound or guarding, bowel sounds noted throughout abdomen GU:no cva tenderness Rectal-no rectal mass, no rectal abscess, no large hemorrhoids, no blood, no melena, no fistula noted.  No anal lesions noted well-healed surgical scar noted that does not appear to communicate with rectum Nurse Bronson Ing chaperone present for exam NEURO: Pt is awake/alert/appropriate, moves all extremitiesx4.     EXTREMITIES:  full ROM SKIN: warm, color normal PSYCH: no abnormalities of mood noted, alert  and oriented to situation   ED Treatments / Results  Labs (all labs ordered are listed, but only abnormal results are displayed) Labs Reviewed - No data to display  EKG None  Radiology No results found.  Procedures Procedures  Medications Ordered in ED Medications - No data to display   Initial Impression / Assessment and Plan / ED Course  I have reviewed the triage vital signs and the nursing notes.       No signs of rectal bleeding at this time.  No obvious signs of fistula.  Patient has a difficult time describing her symptoms from earlier today, but at this point her exam is unremarkable. I will refer her back to her general surgeon for further evaluations. Discussed at length need for follow-up, she was given follow-up information  I do not feel further testing including STD testing is warranted as there is no evidence of this at this time. Final Clinical Impressions(s) / ED Diagnoses   Final diagnoses:  Rectal bleeding    ED Discharge Orders    None       [name], MD 10/02/17 0001

## 2017-11-23 ENCOUNTER — Encounter (HOSPITAL_COMMUNITY): Payer: Self-pay | Admitting: Emergency Medicine

## 2017-11-23 ENCOUNTER — Other Ambulatory Visit: Payer: Self-pay

## 2017-11-23 ENCOUNTER — Emergency Department (HOSPITAL_COMMUNITY)
Admission: EM | Admit: 2017-11-23 | Discharge: 2017-11-24 | Disposition: A | Discharge status: Home / Self Care | Payer: Medicaid - Out of State | Attending: Emergency Medicine | Admitting: Emergency Medicine

## 2017-11-23 DIAGNOSIS — R531 Weakness: Secondary | ICD-10-CM | POA: Diagnosis not present

## 2017-11-23 DIAGNOSIS — M25512 Pain in left shoulder: Secondary | ICD-10-CM | POA: Diagnosis present

## 2017-11-23 DIAGNOSIS — F17210 Nicotine dependence, cigarettes, uncomplicated: Secondary | ICD-10-CM | POA: Diagnosis not present

## 2017-11-23 DIAGNOSIS — M792 Neuralgia and neuritis, unspecified: Secondary | ICD-10-CM

## 2017-11-23 NOTE — ED Triage Notes (Signed)
Patient c/o L shoulder pain that started at her scapula and moved down her L arm. Patient states now the arm feels heavy and she can't move it much. Onset of symptoms 1800 tonight. Patient states she is having some tingling in her hand.

## 2017-11-24 ENCOUNTER — Emergency Department (HOSPITAL_COMMUNITY): Payer: Medicaid - Out of State

## 2017-11-24 LAB — CBC WITH DIFFERENTIAL/PLATELET
Basophils Absolute: 0 10*3/uL (ref 0.0–0.1)
Basophils Relative: 0 %
EOS ABS: 0.1 10*3/uL (ref 0.0–0.7)
EOS PCT: 2 %
HCT: 38.7 % (ref 36.0–46.0)
Hemoglobin: 13.1 g/dL (ref 12.0–15.0)
LYMPHS ABS: 3.4 10*3/uL (ref 0.7–4.0)
Lymphocytes Relative: 49 %
MCH: 28.9 pg (ref 26.0–34.0)
MCHC: 33.9 g/dL (ref 30.0–36.0)
MCV: 85.4 fL (ref 78.0–100.0)
MONO ABS: 0.4 10*3/uL (ref 0.1–1.0)
Monocytes Relative: 5 %
Neutro Abs: 3 10*3/uL (ref 1.7–7.7)
Neutrophils Relative %: 44 %
PLATELETS: 324 10*3/uL (ref 150–400)
RBC: 4.53 MIL/uL (ref 3.87–5.11)
RDW: 15.1 % (ref 11.5–15.5)
WBC: 6.9 10*3/uL (ref 4.0–10.5)

## 2017-11-24 LAB — I-STAT TROPONIN, ED: TROPONIN I, POC: 0 ng/mL (ref 0.00–0.08)

## 2017-11-24 LAB — BASIC METABOLIC PANEL
Anion gap: 10 (ref 5–15)
BUN: 10 mg/dL (ref 6–20)
CHLORIDE: 110 mmol/L (ref 98–111)
CO2: 24 mmol/L (ref 22–32)
CREATININE: 0.84 mg/dL (ref 0.44–1.00)
Calcium: 9.4 mg/dL (ref 8.9–10.3)
GFR calc Af Amer: >60 mL/min (ref >60)
GLUCOSE: 79 mg/dL (ref 70–99)
Potassium: 3.5 mmol/L (ref 3.5–5.1)
Sodium: 144 mmol/L (ref 135–145)

## 2017-11-24 MED ORDER — CYCLOBENZAPRINE HCL 10 MG PO TABS: 10.0000 mg | ORAL_TABLET | Freq: Two times a day (BID) | ORAL | 0 refills | Status: DC | PRN

## 2017-11-24 NOTE — ED Notes (Signed)
Pt returned from xray

## 2017-11-24 NOTE — Discharge Instructions (Signed)
Ibuprofen 600 mg 3 times daily for the next 5 days.  Flexeril as prescribed as needed for pain not relieved with ibuprofen.  Follow up with your primary doctor if symptoms are not improving in the next week.

## 2017-11-24 NOTE — ED Provider Notes (Signed)
[facility] EMERGENCY DEPARTMENT Provider Note   CSN: [id number] Arrival date & time: 11/23/17  2237     History   Chief Complaint Chief Complaint  Patient presents with  . Extremity Weakness    HPI Susan Greene is a 32 y.o. female.  Patient is a 32 year old female with no significant past medical history.  She presents for evaluation of left shoulder and arm pain.  This started approximately 6 PM this evening.  She states that she started with sharp pain under her left shoulder blade that radiated into her arm with associated left arm numbness.  She denies any weakness.  She denies any involvement of her legs.  She denies any difficulty breathing.  He states she does perform repetitive movements at work, but denies any specific injury or trauma.  The history is provided by the patient.  Extremity Weakness  This is a new problem. Episode onset: 6 PM. The problem occurs constantly. The problem has not changed since onset.Pertinent negatives include no chest pain and no shortness of breath. Nothing aggravates the symptoms. Nothing relieves the symptoms. She has tried nothing for the symptoms.    Past Medical History:  Diagnosis Date  . Back pain   . PONV (postoperative nausea and vomiting)     There are no active problems to display for this patient.   Past Surgical History:  Procedure Laterality Date  . INCISION AND DRAINAGE PERIRECTAL ABSCESS  02/25/2012   Procedure: IRRIGATION AND DEBRIDEMENT PERIRECTAL ABSCESS;  Surgeon: Dalia Heading, MD;  Location: [facility];  Service: General;  Laterality: N/A;  Fistulotomy  . KNEE ARTHROSCOPY  Right knee  . TUBAL LIGATION       OB History    Gravida  1   Para  1   Term  1   Preterm      AB      Living  1     SAB      TAB      Ectopic      Multiple      Live Births               Home Medications    Prior to Admission medications   Not on File    Family History History reviewed. No pertinent family  history.  Social History Social History   Tobacco Use  . Smoking status: Current Every [place] Smoker    Packs/Viglione: 0.50    Types: Cigarettes  . Smokeless tobacco: Never Used  . Tobacco comment: 1/2 pack of cigarettes per week   Substance Use Topics  . Alcohol use: Yes    Comment: couple beers on the weekend  . Drug use: No     Allergies   Patient has no known allergies.   Review of Systems Review of Systems  Respiratory: Negative for shortness of breath.   Cardiovascular: Negative for chest pain.  Musculoskeletal: Positive for extremity weakness.  All other systems reviewed and are negative.    Physical Exam Updated Vital Signs BP 117/61   Pulse 93   Temp 98 F (36.7 C) (Oral)   Resp (!) 22   Ht 5\' 5"  (1.651 m)   Wt 72.6 kg   LMP 11/11/2017   SpO2 97%   BMI 26.63 kg/m   Physical Exam  Constitutional: She is oriented to person, place, and time. She appears well-developed and well-nourished. No distress.  HENT:  Head: Normocephalic and atraumatic.  Neck: Normal range of motion. Neck supple.  Cardiovascular: Normal rate and regular rhythm. Exam reveals no gallop and no friction rub.  No murmur heard. Pulmonary/Chest: Effort normal and breath sounds normal. No respiratory distress. She has no wheezes.  Abdominal: Soft. Bowel sounds are normal. She exhibits no distension. There is no tenderness.  Musculoskeletal: Normal range of motion.  Neurological: She is alert and oriented to person, place, and time.  Strength is 5 out of 5 in both upper extremities.  Handgrip is normal and she is able to flex, extend, and oppose all fingers.  Sensation is intact throughout both hands.  Skin: Skin is warm and dry. She is not diaphoretic.  Nursing note and vitals reviewed.    ED Treatments / Results  Labs (all labs ordered are listed, but only abnormal results are displayed) Labs Reviewed  BASIC METABOLIC PANEL  CBC WITH DIFFERENTIAL/PLATELET  I-STAT TROPONIN, ED     EKG EKG Interpretation  Date/Time:  Wednesday November 23 2017 22:53:56 EDT Ventricular Rate:  106 PR Interval:  152 QRS Duration: 80 QT Interval:  326 QTC Calculation: 433 R Axis:   55 Text Interpretation:  Sinus tachycardia Possible Left atrial enlargement Nonspecific T wave abnormality Abnormal ECG Confirmed by Geoffery Lyons ([id number]) on 11/23/2017 10:58:58 PM   Radiology No results found.  Procedures Procedures (including critical care time)  Medications Ordered in ED Medications - No data to display   Initial Impression / Assessment and Plan / ED Course  I have reviewed the triage vital signs and the nursing notes.  Pertinent labs & imaging results that were available during my care of the patient were reviewed by me and considered in my medical decision making (see chart for details).  Patient with what appears to be radicular left shoulder pain.  I see nothing that indicates a cardiac etiology.  She will be discharged with anti-inflammatories, muscle relaxer, and follow-up as needed.  Final Clinical Impressions(s) / ED Diagnoses   Final diagnoses:  None    ED Discharge Orders    None       Geoffery Lyons, MD 11/24/17 [phone]

## 2018-01-22 ENCOUNTER — Other Ambulatory Visit: Payer: Self-pay

## 2018-01-22 ENCOUNTER — Emergency Department (HOSPITAL_COMMUNITY)
Admission: EM | Admit: 2018-01-22 | Discharge: 2018-01-22 | Disposition: A | Discharge status: Home / Self Care | Payer: Medicaid - Out of State | Attending: Emergency Medicine | Admitting: Emergency Medicine

## 2018-01-22 ENCOUNTER — Encounter (HOSPITAL_COMMUNITY): Payer: Self-pay | Admitting: Emergency Medicine

## 2018-01-22 ENCOUNTER — Emergency Department (HOSPITAL_COMMUNITY): Payer: Medicaid - Out of State

## 2018-01-22 DIAGNOSIS — Z23 Encounter for immunization: Secondary | ICD-10-CM | POA: Diagnosis not present

## 2018-01-22 DIAGNOSIS — Y999 Unspecified external cause status: Secondary | ICD-10-CM | POA: Insufficient documentation

## 2018-01-22 DIAGNOSIS — F17210 Nicotine dependence, cigarettes, uncomplicated: Secondary | ICD-10-CM | POA: Diagnosis not present

## 2018-01-22 DIAGNOSIS — S61511A Laceration without foreign body of right wrist, initial encounter: Secondary | ICD-10-CM | POA: Diagnosis not present

## 2018-01-22 DIAGNOSIS — W25XXXA Contact with sharp glass, initial encounter: Secondary | ICD-10-CM | POA: Insufficient documentation

## 2018-01-22 DIAGNOSIS — Y9389 Activity, other specified: Secondary | ICD-10-CM | POA: Diagnosis not present

## 2018-01-22 DIAGNOSIS — Y929 Unspecified place or not applicable: Secondary | ICD-10-CM | POA: Diagnosis not present

## 2018-01-22 LAB — POC URINE PREG, ED: Preg Test, Ur: NEGATIVE

## 2018-01-22 MED ORDER — ACETAMINOPHEN 325 MG PO TABS
650.0000 mg | ORAL_TABLET | Freq: Once | ORAL | Status: AC
  Administered 2018-01-22: 650 mg via ORAL
  Filled 2018-01-22: qty 2

## 2018-01-22 MED ORDER — LIDOCAINE HCL (PF) 1 % IJ SOLN: 5.0000 mL | Freq: Once | INTRAMUSCULAR | Status: DC

## 2018-01-22 MED ORDER — TETANUS-DIPHTH-ACELL PERTUSSIS 5-2.5-18.5 LF-MCG/0.5 IM SUSP
0.5000 mL | Freq: Once | INTRAMUSCULAR | Status: AC
  Administered 2018-01-22: 0.5 mL via INTRAMUSCULAR
  Filled 2018-01-22: qty 0.5

## 2018-01-22 MED ORDER — LIDOCAINE HCL (PF) 2 % IJ SOLN
INTRAMUSCULAR | Status: AC
  Filled 2018-01-22: qty 10

## 2018-01-22 NOTE — ED Triage Notes (Signed)
Pt states that she cut her arm on a broken window this morning bleeding controled

## 2018-01-22 NOTE — Discharge Instructions (Signed)
You have been diagnosed today with laceration of the right wrist. At this time there does not appear to be the presence of an emergent medical condition, however there is always the potential for conditions to change. Please read and follow the below instructions.  Please return to the Emergency Department immediately for any new or worsening symptoms. Please be sure to follow up with your Primary Care Provider next week regarding your visit today; please call their office to schedule an appointment even if you are feeling better for a follow-up visit. Please keep the wound clean and change your bandage twice daily.  You may use over-the-counter antibiotic ointment on the area for the next 2 days.  Your stitches must be removed in 7 days.  You may have this performed at your primary care doctor or here at the emergency department.  It is possible that the skin may fall off due to the shape of your laceration.  Please be sure to return for any new or worsening symptoms.  Get help right away if: You develop severe swelling around the injury site. Your pain suddenly increases and is severe. You develop painful lumps near the wound or on skin that is anywhere on your body. You have a red streak going away from your wound. The wound is on your hand or foot and you cannot properly move a finger or toe. The wound is on your hand or foot and you notice that your fingers or toes look pale or bluish. You have a fever. A wound that was closed breaks open. You notice a bad smell coming from the wound. You notice something coming out of the wound, such as wood or glass. Your pain is not controlled with medicine. You have increased redness, swelling, or pain at the site of your wound. You have fluid, blood, or pus coming from your wound. You notice a change in the color of your skin near your wound. You need to change the dressing frequently due to fluid, blood, or pus draining from the wound.  Please read  the additional information packets attached to your discharge summary.  Do not take your medicine if  develop an itchy rash, swelling in your mouth or lips, or difficulty breathing.

## 2018-01-22 NOTE — ED Provider Notes (Signed)
[facility] EMERGENCY DEPARTMENT Provider Note   CSN: [id number] Arrival date & time: 01/22/18  7829     History   Chief Complaint Chief Complaint  Patient presents with  . Laceration    HPI Susan Greene is a 32 y.o. female presents today for laceration that occurred just prior to arrival.  Patient states that she was knocking on a window when her hand went through the window.  Patient states that her pain is a moderate intensity, sharp, constant pain that is worse with palpation and movement of the wrist.  Patient controlled bleeding with direct pressure prior to arrival.  States that she is unsure of her last tetanus shot, states that has been greater than 5 years.  HPI  Past Medical History:  Diagnosis Date  . Back pain   . PONV (postoperative nausea and vomiting)     There are no active problems to display for this patient.   Past Surgical History:  Procedure Laterality Date  . INCISION AND DRAINAGE PERIRECTAL ABSCESS  02/25/2012   Procedure: IRRIGATION AND DEBRIDEMENT PERIRECTAL ABSCESS;  Surgeon: Dalia Heading, MD;  Location: [facility];  Service: General;  Laterality: N/A;  Fistulotomy  . KNEE ARTHROSCOPY  Right knee  . TUBAL LIGATION       OB History    Gravida  1   Para  1   Term  1   Preterm      AB      Living  1     SAB      TAB      Ectopic      Multiple      Live Births               Home Medications    Prior to Admission medications   Medication Sig Start Date End Date Taking? Authorizing Provider  cyclobenzaprine (FLEXERIL) 10 MG tablet Take 1 tablet (10 mg total) by mouth 2 (two) times daily as needed for muscle spasms. 11/24/17   Geoffery Lyons, MD    Family History No family history on file.  Social History Social History   Tobacco Use  . Smoking status: Current Every [place] Smoker    Packs/[place]: 0.50    Types: Cigarettes  . Smokeless tobacco: Never Used  . Tobacco comment: 1/2 pack of cigarettes per week   Substance Use  Topics  . Alcohol use: Yes    Comment: couple beers on the weekend  . Drug use: No     Allergies   Patient has no known allergies.   Review of Systems Review of Systems  Constitutional: Negative.  Negative for chills and fever.  Musculoskeletal: Negative.  Negative for arthralgias and myalgias.  Skin: Positive for wound.  Neurological: Negative.  Negative for weakness and numbness.   Physical Exam Updated Vital Signs BP 120/74   Pulse 88   Temp 97.7 F (36.5 C) (Oral)   Resp 15   LMP 01/22/2018   SpO2 99%   Physical Exam  Constitutional: She appears well-developed and well-nourished. No distress.  HENT:  Head: Normocephalic and atraumatic.  Right Ear: External ear normal.  Left Ear: External ear normal.  Nose: Nose normal.  Eyes: Pupils are equal, round, and reactive to light. EOM are normal.  Neck: Trachea normal and normal range of motion. No tracheal deviation present.  Cardiovascular: Normal rate and regular rhythm.  Pulses:      Radial pulses are 2+ on the right side, and 2+ on  the left side.  Pulmonary/Chest: Effort normal. No respiratory distress.  Abdominal: Soft. There is no tenderness. There is no rebound and no guarding.  Musculoskeletal: Normal range of motion.  Right hand: Small laceration present to the volar wrist.  No gross deformity.  Fingers appear normal.  No injury of the hand. Tenderness to palpation rectally over laceration.  No snuffbox tenderness to palpation. No tenderness to palpation over flexor sheath.  Finger adduction/abduction intact with 5/5 strength.  Thumb opposition intact. Full active and resisted ROM to flexion/extension at wrist, MCP, PIP and DIP of all fingers.  FDS/FDP intact. Grip 5/5 strength.  Radial artery 2+ with <2sec cap refill in all fingers.  Sensation intact to light-tough in median/ulnar/radial distributions.  Neurological: She is alert. GCS eye subscore is 4. GCS verbal subscore is 5. GCS motor subscore is 6.    Speech is clear and goal oriented, follows commands Major Cranial nerves without deficit, no facial droop Normal strength in upper and lower extremities bilaterally including dorsiflexion and plantar flexion, strong and equal grip strength Sensation normal to light touch Moves extremities without ataxia, coordination intact Normal gait  Skin: Skin is warm and dry. Capillary refill takes less than 2 seconds.  Superficial semicircular laceration to the volar right wrist.  No vascular, nerve or tendon involvement.  Psychiatric: She has a normal mood and affect. Her behavior is normal.        ED Treatments / Results  Labs (all labs ordered are listed, but only abnormal results are displayed) Labs Reviewed  POC URINE PREG, ED    EKG None  Radiology Dg Wrist Complete Right  Result Date: 01/22/2018 CLINICAL DATA:  Laceration. EXAM: RIGHT WRIST - COMPLETE 3+ VIEW COMPARISON:  None. FINDINGS: There is no evidence of fracture or dislocation. There is no evidence of arthropathy or other focal bone abnormality. Soft tissues are unremarkable. IMPRESSION: Negative. Electronically Signed   By: Signa Kell M.D.   On: 01/22/2018 09:57    Procedures .Marland KitchenLaceration Repair Date/Time: 01/22/2018 11:39 AM Performed by: Bill Salinas, PA-C Authorized by: Bill Salinas, PA-C   Consent:    Consent obtained:  Verbal   Consent given by:  Patient   Risks discussed:  Infection, pain, retained foreign body, tendon damage, poor cosmetic result, need for additional repair, nerve damage, poor wound healing and vascular damage Anesthesia (see MAR for exact dosages):    Anesthesia method:  Local infiltration   Local anesthetic:  Lidocaine 2% w/o epi Laceration details:    Location:  Hand   Hand location:  R wrist   Length (cm):  1.5   Depth (mm):  3 Repair type:    Repair type:  Simple Pre-procedure details:    Preparation:  Patient was prepped and draped in usual sterile fashion and  imaging obtained to evaluate for foreign bodies Exploration:    Hemostasis achieved with:  Direct pressure   Wound exploration: wound explored through full range of motion and entire depth of wound probed and visualized     Wound extent: no foreign bodies/material noted, no muscle damage noted, no nerve damage noted, no tendon damage noted, no underlying fracture noted and no vascular damage noted     Contaminated: no   Treatment:    Area cleansed with:  Betadine and saline   Amount of cleaning:  Extensive   Irrigation solution:  Sterile saline   Irrigation volume:  1 L   Irrigation method:  Pressure wash Skin repair:  Repair method:  Sutures   Suture size:  5-0   Suture material:  Prolene   Suture technique:  Simple interrupted   Number of sutures:  3 Approximation:    Approximation:  Close Post-procedure details:    Dressing:  Antibiotic ointment, non-adherent dressing and sterile dressing   Patient tolerance of procedure:  Tolerated well, no immediate complications   (including critical care time)  Medications Ordered in ED Medications  lidocaine (PF) (XYLOCAINE) 1 % injection 5 mL (has no administration in time range)  lidocaine (XYLOCAINE) 2 % injection (has no administration in time range)  Tdap (BOOSTRIX) injection 0.5 mL (0.5 mLs Intramuscular Given 01/22/18 0946)  acetaminophen (TYLENOL) tablet 650 mg (650 mg Oral Given 01/22/18 0943)     Initial Impression / Assessment and Plan / ED Course  I have reviewed the triage vital signs and the nursing notes.  Pertinent labs & imaging results that were available during my care of the patient were reviewed by me and considered in my medical decision making (see chart for details).    Jasreet T Mohler is a 32 y.o. female who presents to ED for laceration of the right wrist just prior to arrival. Wound thoroughly cleaned in ED today. Wound explored and bottom of wound seen in a bloodless field. Laceration repaired as dictated  above. Patient counseled on home wound care. Follow up with PCP/urgent care or return to ER for suture removal in 7 days. Patient was urged to return to the Emergency Department for worsening pain, swelling, expanding erythema especially if it streaks away from the affected area, fever, or for any additional concerns.  Urine pregnancy test negative.  Tdap updated.  Patient verbalized understanding of return precautions. All questions answered.  Imaging negative for foreign bodies.  Foreign bodies on evaluation.  Clean wound, cleaned thoroughly here in emergency department.  No antibiotics indicated at this time.  At this time there does not appear to be any evidence of an acute emergency medical condition and the patient appears stable for discharge with appropriate outpatient follow up. Diagnosis was discussed with patient who verbalizes understanding of care plan and is agreeable to discharge. I have discussed return precautions with patient and friend at bedsidewho verbalize understanding of return precautions. Patient strongly encouraged to follow-up with their PCP within next week. All questions answered.  Patient seen and evaluated by Dr. Effie ShyWentz who agrees with repair, discharge and follow-up.  At time of discharge patient is afebrile, not tachycardic, not hypotensive, well-appearing and in no acute distress.  Patient states satisfaction with suture today.  Note: Portions of this report may have been transcribed using voice recognition software. Every effort was made to ensure accuracy; however, inadvertent computerized transcription errors may still be present.  Final Clinical Impressions(s) / ED Diagnoses   Final diagnoses:  Laceration of right wrist, initial encounter    ED Discharge Orders    None       Elizabeth PalauMorelli, Anden Bartolo A, PA-C 01/22/18 1145    Mancel BaleWentz, Elliott, MD 01/22/18 1646

## 2018-01-27 ENCOUNTER — Other Ambulatory Visit: Payer: Self-pay

## 2018-01-27 ENCOUNTER — Emergency Department (HOSPITAL_COMMUNITY)
Admission: EM | Admit: 2018-01-27 | Discharge: 2018-01-27 | Disposition: A | Discharge status: Home / Self Care | Payer: Medicaid - Out of State | Attending: Emergency Medicine | Admitting: Emergency Medicine

## 2018-01-27 ENCOUNTER — Encounter (HOSPITAL_COMMUNITY): Payer: Self-pay | Admitting: Emergency Medicine

## 2018-01-27 DIAGNOSIS — F17210 Nicotine dependence, cigarettes, uncomplicated: Secondary | ICD-10-CM | POA: Diagnosis not present

## 2018-01-27 DIAGNOSIS — Z4802 Encounter for removal of sutures: Secondary | ICD-10-CM | POA: Insufficient documentation

## 2018-01-27 MED ORDER — BACITRACIN-NEOMYCIN-POLYMYXIN 400-5-5000 EX OINT
TOPICAL_OINTMENT | Freq: Once | CUTANEOUS | Status: AC
  Administered 2018-01-27: 1 via TOPICAL
  Filled 2018-01-27: qty 1

## 2018-01-27 MED ORDER — BACITRACIN ZINC 500 UNIT/GM EX OINT
TOPICAL_OINTMENT | CUTANEOUS | Status: AC
  Filled 2018-01-27: qty 0.9

## 2018-01-27 NOTE — ED Triage Notes (Signed)
Patient is here to have sutures in right wrist removed.

## 2018-01-27 NOTE — ED Provider Notes (Signed)
[facility] EMERGENCY DEPARTMENT Provider Note   CSN: [id number] Arrival date & time: 01/27/18  2019     History   Chief Complaint Chief Complaint  Patient presents with  . Suture / Staple Removal    HPI Susan Greene is a 32 y.o. female who presents to the ED for suture removal. Sutures are in the right wrist area. Patient reports that one came out. No problems.   HPI  Past Medical History:  Diagnosis Date  . Back pain   . PONV (postoperative nausea and vomiting)     There are no active problems to display for this patient.   Past Surgical History:  Procedure Laterality Date  . INCISION AND DRAINAGE PERIRECTAL ABSCESS  02/25/2012   Procedure: IRRIGATION AND DEBRIDEMENT PERIRECTAL ABSCESS;  Surgeon: Dalia Heading, MD;  Location: [facility];  Service: General;  Laterality: N/A;  Fistulotomy  . KNEE ARTHROSCOPY  Right knee  . TUBAL LIGATION       OB History    Gravida  1   Para  1   Term  1   Preterm      AB      Living  1     SAB      TAB      Ectopic      Multiple      Live Births               Home Medications    Prior to Admission medications   Medication Sig Start Date End Date Taking? Authorizing Provider  cyclobenzaprine (FLEXERIL) 10 MG tablet Take 1 tablet (10 mg total) by mouth 2 (two) times daily as needed for muscle spasms. 11/24/17   Geoffery Lyons, MD    Family History History reviewed. No pertinent family history.  Social History Social History   Tobacco Use  . Smoking status: Current Every [place] Smoker    Packs/[place]: 0.50    Types: Cigarettes  . Smokeless tobacco: Never Used  . Tobacco comment: 1/2 pack of cigarettes per week   Substance Use Topics  . Alcohol use: Yes    Comment: couple beers on the weekend  . Drug use: No     Allergies   Patient has no known allergies.   Review of Systems Review of Systems  Skin: Positive for wound.     Physical Exam Updated Vital Signs BP (!) 155/96 (BP Location: Right Arm)    Pulse 88   Temp 97.6 F (36.4 C)   Resp 16   Ht 5\' 4"  (1.626 m)   Wt 72.6 kg   LMP 01/22/2018   SpO2 100%   BMI 27.46 kg/m   Physical Exam  Constitutional: She appears well-developed and well-nourished. No distress.  HENT:  Head: Normocephalic.  Eyes: EOM are normal.  Neck: Neck supple.  Cardiovascular: Normal rate.  Pulmonary/Chest: Effort normal.  Musculoskeletal: Normal range of motion.  Neurological: She is alert.  Skin:  Sutures in place right wrist. No drainage or erythema. Wound healing well.   Psychiatric: She has a normal mood and affect.  Nursing note and vitals reviewed.    ED Treatments / Results  Labs (all labs ordered are listed, but only abnormal results are displayed) Labs Reviewed - No data to display  Radiology No results found.  Procedures .Suture Removal Date/Time: 01/27/2018 8:35 PM Performed by: Janne Napoleon, NP Authorized by: Janne Napoleon, NP   Consent:    Consent obtained:  Verbal   Consent  given by:  Patient   Risks discussed:  Bleeding, pain and wound separation   Alternatives discussed:  No treatment Location:    Location:  Upper extremity   Upper extremity location:  Wrist   Wrist location:  R wrist Procedure details:    Wound appearance:  No signs of infection, good wound healing and clean   Number of sutures removed:  3 Post-procedure details:    Post-removal:  Antibiotic ointment applied and dressing applied   Patient tolerance of procedure:  Tolerated well, no immediate complications   (including critical care time)  Medications Ordered in ED Medications  neomycin-bacitracin-polymyxin (NEOSPORIN) ointment (1 application Topical Given 01/27/18 2034)     Initial Impression / Assessment and Plan / ED Course  I have reviewed the triage vital signs and the nursing notes.  32 y.o. female here for wound check and suture removal stable for d/c without signs of infections.  Final Clinical Impressions(s) / ED Diagnoses    Final diagnoses:  Visit for suture removal    ED Discharge Orders    None       Kerrie Buffaloeese, Hope RinconM, [place]NP 01/27/18 2145    Vanetta MuldersZackowski, Scott, MD 01/28/18 1905

## 2018-04-01 ENCOUNTER — Emergency Department (HOSPITAL_COMMUNITY)
Admission: EM | Admit: 2018-04-01 | Discharge: 2018-04-01 | Disposition: A | Discharge status: Home / Self Care | Payer: Medicaid - Out of State

## 2018-04-30 ENCOUNTER — Other Ambulatory Visit: Payer: Self-pay

## 2018-04-30 ENCOUNTER — Emergency Department (HOSPITAL_COMMUNITY): Payer: Medicaid - Out of State

## 2018-04-30 ENCOUNTER — Encounter (HOSPITAL_COMMUNITY): Payer: Self-pay | Admitting: Emergency Medicine

## 2018-04-30 ENCOUNTER — Emergency Department (HOSPITAL_COMMUNITY)
Admission: EM | Admit: 2018-04-30 | Discharge: 2018-04-30 | Disposition: A | Discharge status: Home / Self Care | Payer: Medicaid - Out of State | Attending: Emergency Medicine | Admitting: Emergency Medicine

## 2018-04-30 DIAGNOSIS — Y92008 Other place in unspecified non-institutional (private) residence as the place of occurrence of the external cause: Secondary | ICD-10-CM | POA: Insufficient documentation

## 2018-04-30 DIAGNOSIS — Y939 Activity, unspecified: Secondary | ICD-10-CM | POA: Insufficient documentation

## 2018-04-30 DIAGNOSIS — S52255A Nondisplaced comminuted fracture of shaft of ulna, left arm, initial encounter for closed fracture: Secondary | ICD-10-CM | POA: Diagnosis not present

## 2018-04-30 DIAGNOSIS — F17210 Nicotine dependence, cigarettes, uncomplicated: Secondary | ICD-10-CM | POA: Diagnosis not present

## 2018-04-30 DIAGNOSIS — W108XXA Fall (on) (from) other stairs and steps, initial encounter: Secondary | ICD-10-CM | POA: Insufficient documentation

## 2018-04-30 DIAGNOSIS — S59912A Unspecified injury of left forearm, initial encounter: Secondary | ICD-10-CM | POA: Diagnosis present

## 2018-04-30 DIAGNOSIS — Y999 Unspecified external cause status: Secondary | ICD-10-CM | POA: Insufficient documentation

## 2018-04-30 MED ORDER — HYDROCODONE-ACETAMINOPHEN 5-325 MG PO TABS
1.0000 | ORAL_TABLET | Freq: Once | ORAL | Status: AC
  Administered 2018-04-30: 1 via ORAL
  Filled 2018-04-30: qty 1

## 2018-04-30 MED ORDER — HYDROCODONE-ACETAMINOPHEN 5-325 MG PO TABS: 1.0000 | ORAL_TABLET | ORAL | 0 refills | Status: DC | PRN

## 2018-04-30 NOTE — ED Triage Notes (Signed)
Pt reports falling down 16 wood steps.  Denies hitting head.  C/o  Left arm pain,rating pain 10/10.  Weak left hand grip.

## 2018-04-30 NOTE — Discharge Instructions (Addendum)
Call the orthopedist listed, either Dr. Aundria Rud or Dr. Romeo Apple if you prefer to keep your care in [place].  Call for a recheck appointment within the next week.  Use ice and elevation for pain and relief of swelling.  You may take the hydrocodone prescribed for pain relief.  This will make you drowsy - do not drive within 4 hours of taking this medication.

## 2018-04-30 NOTE — ED Provider Notes (Signed)
[facility] EMERGENCY DEPARTMENT Provider Note   CSN: [id number] Arrival date & time: 04/30/18  1343    History   Chief Complaint Chief Complaint  Patient presents with  . Arm Injury    HPI Susan Greene is a 33 y.o. female.     The history is provided by the patient.  Arm Injury  Location:  Arm Arm location:  L forearm Injury: yes   Time since incident:  5 hours Mechanism of injury: fall   Mechanism of injury comment:  Pt tripped over an object in her home and fell down a full flight of steps.  she denies head injury or any other areas of pain. she denies LOC. Fall:    Fall occurred:  Down stairs   Impact surface:  Water quality scientist of impact:  Outstretched arms   Entrapped after fall: no   Pain details:    Quality:  Throbbing and sharp   Radiates to:  Does not radiate   Severity:  Moderate   Onset quality:  Sudden   Duration:  5 hours   Timing:  Constant   Progression:  Unchanged Handedness:  Right-handed Dislocation: no   Prior injury to area:  No Relieved by:  None tried Worsened by:  Movement Ineffective treatments:  None tried Associated symptoms: decreased range of motion   Associated symptoms: no back pain, no fever, no neck pain, no numbness and no stiffness     Past Medical History:  Diagnosis Date  . Back pain   . PONV (postoperative nausea and vomiting)     There are no active problems to display for this patient.   Past Surgical History:  Procedure Laterality Date  . INCISION AND DRAINAGE PERIRECTAL ABSCESS  02/25/2012   Procedure: IRRIGATION AND DEBRIDEMENT PERIRECTAL ABSCESS;  Surgeon: Dalia Heading, MD;  Location: [facility];  Service: General;  Laterality: N/A;  Fistulotomy  . KNEE ARTHROSCOPY  Right knee  . TUBAL LIGATION       OB History    Gravida  1   Para  1   Term  1   Preterm      AB      Living  1     SAB      TAB      Ectopic      Multiple      Live Births               Home Medications    Prior to  Admission medications   Medication Sig Start Date End Date Taking? Authorizing Provider  cyclobenzaprine (FLEXERIL) 10 MG tablet Take 1 tablet (10 mg total) by mouth 2 (two) times daily as needed for muscle spasms. 11/24/17   Geoffery Lyons, MD  HYDROcodone-acetaminophen (NORCO/VICODIN) 5-325 MG tablet Take 1 tablet by mouth every 4 (four) hours as needed. 04/30/18   Burgess Amor, PA-C    Family History History reviewed. No pertinent family history.  Social History Social History   Tobacco Use  . Smoking status: Current Every [place] Smoker    Packs/[place]: 0.50    Types: Cigarettes  . Smokeless tobacco: Never Used  . Tobacco comment: 1/2 pack of cigarettes per week   Substance Use Topics  . Alcohol use: Yes    Comment: couple beers on the weekend  . Drug use: No     Allergies   Patient has no known allergies.   Review of Systems Review of Systems  Constitutional: Negative for fever.  HENT: Negative.   Respiratory: Negative.   Cardiovascular: Negative.   Gastrointestinal: Negative.   Musculoskeletal: Positive for arthralgias. Negative for back pain, joint swelling, myalgias, neck pain and stiffness.  Neurological: Negative for weakness, numbness and headaches.     Physical Exam Updated Vital Signs BP 113/62 (BP Location: Right Arm)   Pulse 99   Temp 98.3 F (36.8 C) (Oral)   Resp 14   Ht  (1.651 m)   Wt 70.3 kg   LMP 04/10/2018 (Approximate)   SpO2 99%   BMI 25.79 kg/m   Physical Exam Constitutional:      Appearance: She is well-developed.  HENT:     Head: Atraumatic.  Neck:     Musculoskeletal: Normal range of motion.  Cardiovascular:     Pulses:          Radial pulses are 2+ on the right side and 2+ on the left side.     Comments: Pulses equal bilaterally.  Less than 2 sec cap refill in fingertips. Musculoskeletal:        General: Tenderness present.     Left forearm: She exhibits bony tenderness and swelling. She exhibits no deformity.     Comments:  TTP mid left forearm with edema noted. Elbow, upper arm, shoulder, wrist and hand nontender.   Skin:    General: Skin is warm and dry.  Neurological:     Mental Status: She is alert.     Sensory: Sensation is intact. No sensory deficit.     Deep Tendon Reflexes: Reflexes normal.     Comments: Equal grip strength      ED Treatments / Results  Labs (all labs ordered are listed, but only abnormal results are displayed) Labs Reviewed - No data to display  EKG None  Radiology Dg Forearm Left  Result Date: 04/30/2018 CLINICAL DATA:  Pain after fall EXAM: LEFT FOREARM - 2 VIEW COMPARISON:  None. FINDINGS: There is a comminuted mildly displaced fracture through the mid ulnar diaphysis. No other acute abnormalities. IMPRESSION: Comminuted mildly displaced fracture through the mid ulnar diaphysis. Electronically Signed   By: Gerome Sam III M.D   On: 04/30/2018 15:23    Procedures Procedures (including critical care time)  SPLINT APPLICATION Date/Time: 4:00 PM Authorized by: Burgess Amor Consent: Verbal consent obtained. Risks and benefits: risks, benefits and alternatives were discussed Consent given by: patient Splint applied by: RN Location details: right forearm Splint type: sugar tong Supplies used: webril, fiberglass, ace wrap, sling Post-procedure: The splinted body part was neurovascularly unchanged following the procedure. Patient tolerance: Patient tolerated the procedure well with no immediate complications.     Medications Ordered in ED Medications  HYDROcodone-acetaminophen (NORCO/VICODIN) 5-325 MG per tablet 1 tablet (1 tablet Oral Given 04/30/18 1524)     Initial Impression / Assessment and Plan / ED Course  I have reviewed the triage vital signs and the nursing notes.  Pertinent labs & imaging results that were available during my care of the patient were reviewed by me and considered in my medical decision making (see chart for details).         Imaging reviewed and discussed with pt.  She was placed in a sugar tong splint, sling provided.  Hydrocodone. Referral to ortho, Dr. Aundria Rud.  Pt states has seen Dr. Romeo Apple in the past, over 10 years ago, referral also to Dr. Romeo Apple as well, pt indicating she would like to keep her care with him if possible.  Advised to call tomorrow  to arrange f/u.  RICE.   Final Clinical Impressions(s) / ED Diagnoses   Final diagnoses:  Closed nondisplaced comminuted fracture of shaft of left ulna, initial encounter    ED Discharge Orders         Ordered    HYDROcodone-acetaminophen (NORCO/VICODIN) 5-325 MG tablet  Every 4 hours PRN     04/30/18 1520           Burgess Amor, PA-C 04/30/18 1601    Sabas Sous, MD 04/30/18 [phone]

## 2018-05-03 ENCOUNTER — Telehealth: Payer: Self-pay | Admitting: Orthopedic Surgery

## 2018-05-03 NOTE — Telephone Encounter (Signed)
Call from patient (ph# [phone]); relays that she was seen at [facility] Emergency room 04/30/18 for "fractured arm"; discussed appointment, pending referral if her plan requires primary care referral. Explained to patient I will pull up the information and call her back shortly, which I have done. Reached voicemail, left a message to return our call.* *Upon reviewing the information, chart indicates "IMPRESSION: Comminuted mildly displaced fracture through the mid ulnar diaphysis." Patient's visit summary shows Dr Duwayne Heck, [facility], or if elects [place], Dr Harrison(Dr Romeo Apple was not on call at time of ER visit)  - Also indicated in chart: insurance Out of state Medicaid, in which we are considered out of network. Review with patient upon return call.

## 2018-05-04 NOTE — Telephone Encounter (Signed)
Ms. Agrawal called back late this afternoon.  She said she was unsure where to go for treatment and she also doesn't know if she needs referral. She wanted to know if she was going to need surgery.  I told her that I couldn't answer that.  I told her that she did have some options.  I told her that if she saw a physician in [place], I was unsure if her insurance would cover it due to the fact that is was [place]. I told her that she could call Dr Aundria Rud' office and speak with them regarding this.  I did also tell her that [facility] has orthopedics there and more than likely accept her insurance.  She said she was going to call as soon as we hung up.

## 2018-05-10 NOTE — Telephone Encounter (Signed)
No further response. 

## 2019-02-28 ENCOUNTER — Emergency Department (HOSPITAL_COMMUNITY)
Admission: EM | Admit: 2019-02-28 | Discharge: 2019-02-28 | Disposition: A | Discharge status: Home / Self Care | Payer: Medicaid - Out of State | Attending: Emergency Medicine | Admitting: Emergency Medicine

## 2019-02-28 ENCOUNTER — Other Ambulatory Visit: Payer: Self-pay

## 2019-02-28 ENCOUNTER — Encounter (HOSPITAL_COMMUNITY): Payer: Self-pay

## 2019-02-28 DIAGNOSIS — Z5321 Procedure and treatment not carried out due to patient leaving prior to being seen by health care provider: Secondary | ICD-10-CM | POA: Insufficient documentation

## 2019-02-28 DIAGNOSIS — M545 Low back pain: Secondary | ICD-10-CM | POA: Diagnosis not present

## 2019-02-28 DIAGNOSIS — R200 Anesthesia of skin: Secondary | ICD-10-CM | POA: Insufficient documentation

## 2019-02-28 NOTE — ED Triage Notes (Signed)
Pt c/o lower back pain and bilateral hand numbness x1 month or more. Came in today because its getting worse.

## 2019-03-13 ENCOUNTER — Encounter (HOSPITAL_COMMUNITY): Payer: Self-pay | Admitting: Emergency Medicine

## 2019-03-13 ENCOUNTER — Emergency Department (HOSPITAL_COMMUNITY)
Admission: EM | Admit: 2019-03-13 | Discharge: 2019-03-13 | Disposition: A | Discharge status: Home / Self Care | Payer: Medicaid - Out of State | Attending: Emergency Medicine | Admitting: Emergency Medicine

## 2019-03-13 ENCOUNTER — Emergency Department (HOSPITAL_COMMUNITY)
Admission: EM | Admit: 2019-03-13 | Discharge: 2019-03-13 | Discharge status: Absent without leave | Payer: Medicaid - Out of State

## 2019-03-13 ENCOUNTER — Other Ambulatory Visit: Payer: Self-pay

## 2019-03-13 DIAGNOSIS — K6289 Other specified diseases of anus and rectum: Secondary | ICD-10-CM | POA: Diagnosis present

## 2019-03-13 DIAGNOSIS — Z5321 Procedure and treatment not carried out due to patient leaving prior to being seen by health care provider: Secondary | ICD-10-CM | POA: Diagnosis not present

## 2019-03-13 NOTE — ED Triage Notes (Signed)
C/o rectal pain that started Saturday after having sex. Was painful/ no bleeding- normal BM

## 2019-03-13 NOTE — ED Notes (Signed)
Pt was called x3 for the Nurse to take back to rm, no answer, was noted and pt is presumed to be off the grounds and has left the facility

## 2019-03-14 ENCOUNTER — Emergency Department (HOSPITAL_COMMUNITY)
Admission: EM | Admit: 2019-03-14 | Discharge: 2019-03-14 | Disposition: A | Discharge status: Home / Self Care | Payer: Medicaid - Out of State | Attending: Emergency Medicine | Admitting: Emergency Medicine

## 2019-03-14 ENCOUNTER — Other Ambulatory Visit: Payer: Self-pay

## 2019-03-14 ENCOUNTER — Encounter (HOSPITAL_COMMUNITY): Payer: Self-pay

## 2019-03-14 DIAGNOSIS — F17210 Nicotine dependence, cigarettes, uncomplicated: Secondary | ICD-10-CM | POA: Insufficient documentation

## 2019-03-14 DIAGNOSIS — K6289 Other specified diseases of anus and rectum: Secondary | ICD-10-CM | POA: Insufficient documentation

## 2019-03-14 DIAGNOSIS — Z79899 Other long term (current) drug therapy: Secondary | ICD-10-CM | POA: Diagnosis not present

## 2019-03-14 MED ORDER — HYDROCORTISONE ACETATE 25 MG RE SUPP: 25.0000 mg | Freq: Two times a day (BID) | RECTAL | 1 refills | Status: DC

## 2019-03-14 MED ORDER — DOCUSATE SODIUM 100 MG PO CAPS: 100.0000 mg | ORAL_CAPSULE | Freq: Every day | ORAL | 2 refills | Status: DC | PRN
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
## 2019-03-14 NOTE — Discharge Instructions (Signed)
Return if any problems.

## 2019-03-14 NOTE — ED Provider Notes (Signed)
[facility] EMERGENCY DEPARTMENT Provider Note   CSN: [id number] Arrival date & time: 03/14/19  1243     History Chief Complaint  Patient presents with  . Rectal Pain    Susan Greene is a 34 y.o. female.  Pt reports she ws having intercourse on Sunday and her partner accidentally went into her rectum.  Pt reports pain at time of episode.  Pt reports continued pain.  Normal bowel movements, no bleeding.  Pt reports she had a rectal abscess in the past that she had surgery on.    The history is provided by the patient. No language interpreter was used.       Past Medical History:  Diagnosis Date  . Back pain   . PONV (postoperative nausea and vomiting)     There are no problems to display for this patient.   Past Surgical History:  Procedure Laterality Date  . INCISION AND DRAINAGE PERIRECTAL ABSCESS  02/25/2012   Procedure: IRRIGATION AND DEBRIDEMENT PERIRECTAL ABSCESS;  Surgeon: Jamesetta So, MD;  Location: [facility];  Service: General;  Laterality: N/A;  Fistulotomy  . KNEE ARTHROSCOPY  Right knee  . TUBAL LIGATION       OB History    Gravida  1   Para  1   Term  1   Preterm      AB      Living  1     SAB      TAB      Ectopic      Multiple      Live Births              No family history on file.  Social History   Tobacco Use  . Smoking status: Current Every [place] Smoker    Packs/Jeppsen: 0.50    Types: Cigarettes  . Smokeless tobacco: Never Used  . Tobacco comment: 1/2 pack of cigarettes per week   Substance Use Topics  . Alcohol use: Yes    Comment: couple beers on the weekend  . Drug use: No    Home Medications Prior to Admission medications   Medication Sig Start Date End Date Taking? Authorizing Provider  cyclobenzaprine (FLEXERIL) 10 MG tablet Take 1 tablet (10 mg total) by mouth 2 (two) times daily as needed for muscle spasms. 11/24/17   Veryl Speak, MD  docusate sodium (COLACE) 100 MG capsule Take 1 capsule (100 mg total) by  mouth daily as needed. 03/14/19 03/13/20  Fransico Meadow, PA-C  HYDROcodone-acetaminophen (NORCO/VICODIN) 5-325 MG tablet Take 1 tablet by mouth every 4 (four) hours as needed. 04/30/18   Evalee Jefferson, PA-C  hydrocortisone (ANUSOL-HC) 25 MG suppository Place 1 suppository (25 mg total) rectally every 12 (twelve) hours. 03/14/19 03/13/20  Fransico Meadow, PA-C    Allergies    Patient has no known allergies.  Review of Systems   Review of Systems  Gastrointestinal: Positive for rectal pain.  All other systems reviewed and are negative.   Physical Exam Updated Vital Signs BP 122/60 (BP Location: Right Arm)   Pulse 97   Temp 98.3 F (36.8 C) (Oral)   Resp 14   Ht 5\' 5"  (1.651 m)   Wt 68 kg   LMP 03/06/2019   SpO2 100%   BMI 24.96 kg/m   Physical Exam Vitals and nursing note reviewed.  Constitutional:      Appearance: She is well-developed.  HENT:     Head: Normocephalic.  Cardiovascular:  Rate and Rhythm: Normal rate.  Pulmonary:     Effort: Pulmonary effort is normal.  Abdominal:     General: Abdomen is flat. There is no distension.  Genitourinary:    Comments: Tender area lower rectal vault, no mass, no blood,  Musculoskeletal:        General: Normal range of motion.     Cervical back: Normal range of motion.  Skin:    General: Skin is warm.  Neurological:     General: No focal deficit present.     Mental Status: She is alert and oriented to person, place, and time.  Psychiatric:        Mood and Affect: Mood normal.     ED Results / Procedures / Treatments   Labs (all labs ordered are listed, but only abnormal results are displayed) Labs Reviewed - No data to display  EKG None  Radiology No results found.  Procedures Procedures (including critical care time)  Medications Ordered in ED Medications - No data to display  ED Course  I have reviewed the triage vital signs and the nursing notes.  Pertinent labs & imaging results that were available  during my care of the patient were reviewed by me and considered in my medical decision making (see chart for details).    MDM Rules/Calculators/A&P                      MDM  Pt may have a small tear, I counseled pt.  I gave her an rx for anusol and colace.  Pt is advised to return if any problems.  Final Clinical Impression(s) / ED Diagnoses Final diagnoses:  Rectal pain    Rx / DC Orders ED Discharge Orders         Ordered    hydrocortisone (ANUSOL-HC) 25 MG suppository  Every 12 hours     03/14/19 1336    docusate sodium (COLACE) 100 MG capsule  Daily PRN     03/14/19 1336           Osie Cheeks 03/14/19 1349    Bethann Berkshire, MD 03/16/19 1038

## 2019-03-15 ENCOUNTER — Emergency Department (HOSPITAL_COMMUNITY)
Admission: EM | Admit: 2019-03-15 | Discharge: 2019-03-15 | Disposition: A | Discharge status: Home / Self Care | Payer: Medicaid - Out of State | Attending: Emergency Medicine | Admitting: Emergency Medicine

## 2019-03-15 ENCOUNTER — Other Ambulatory Visit: Payer: Self-pay

## 2019-03-15 ENCOUNTER — Encounter (HOSPITAL_COMMUNITY): Payer: Self-pay

## 2019-03-15 ENCOUNTER — Emergency Department (HOSPITAL_COMMUNITY): Payer: Medicaid - Out of State

## 2019-03-15 DIAGNOSIS — K625 Hemorrhage of anus and rectum: Secondary | ICD-10-CM | POA: Diagnosis present

## 2019-03-15 DIAGNOSIS — F17210 Nicotine dependence, cigarettes, uncomplicated: Secondary | ICD-10-CM | POA: Diagnosis not present

## 2019-03-15 DIAGNOSIS — K6289 Other specified diseases of anus and rectum: Secondary | ICD-10-CM | POA: Diagnosis not present

## 2019-03-15 DIAGNOSIS — K611 Rectal abscess: Secondary | ICD-10-CM | POA: Insufficient documentation

## 2019-03-15 LAB — BASIC METABOLIC PANEL
Anion gap: 11 (ref 5–15)
BUN: 9 mg/dL (ref 6–20)
CO2: 26 mmol/L (ref 22–32)
Calcium: 9.7 mg/dL (ref 8.9–10.3)
Chloride: 100 mmol/L (ref 98–111)
Creatinine, Ser: 0.61 mg/dL (ref 0.44–1.00)
GFR calc Af Amer: >60 mL/min (ref >60)
GFR calc non Af Amer: >60 mL/min (ref >60)
Glucose, Bld: 96 mg/dL (ref 70–99)
Potassium: 3.9 mmol/L (ref 3.5–5.1)
Sodium: 137 mmol/L (ref 135–145)

## 2019-03-15 LAB — CBC WITH DIFFERENTIAL/PLATELET
Abs Immature Granulocytes: 0.03 10*3/uL (ref 0.00–0.07)
Basophils Absolute: 0 10*3/uL (ref 0.0–0.1)
Basophils Relative: 0 %
Eosinophils Absolute: 0 10*3/uL (ref 0.0–0.5)
Eosinophils Relative: 1 %
HCT: 39.9 % (ref 36.0–46.0)
Hemoglobin: 12.6 g/dL (ref 12.0–15.0)
Immature Granulocytes: 0 %
Lymphocytes Relative: 18 %
Lymphs Abs: 1.6 10*3/uL (ref 0.7–4.0)
MCH: 27.6 pg (ref 26.0–34.0)
MCHC: 31.6 g/dL (ref 30.0–36.0)
MCV: 87.3 fL (ref 80.0–100.0)
Monocytes Absolute: 0.5 10*3/uL (ref 0.1–1.0)
Monocytes Relative: 5 %
Neutro Abs: 6.6 10*3/uL (ref 1.7–7.7)
Neutrophils Relative %: 76 %
Platelets: 243 10*3/uL (ref 150–400)
RBC: 4.57 MIL/uL (ref 3.87–5.11)
RDW: 14.4 % (ref 11.5–15.5)
WBC: 8.7 10*3/uL (ref 4.0–10.5)
nRBC: 0 % (ref 0.0–0.2)

## 2019-03-15 LAB — POC URINE PREG, ED: Preg Test, Ur: NEGATIVE

## 2019-03-15 MED ORDER — SULFAMETHOXAZOLE-TRIMETHOPRIM 800-160 MG PO TABS: 1.0000 | ORAL_TABLET | Freq: Two times a day (BID) | ORAL | 0 refills | Status: AC

## 2019-03-15 MED ORDER — IOHEXOL 300 MG/ML  SOLN
100.0000 mL | Freq: Once | INTRAMUSCULAR | Status: AC | PRN
  Administered 2019-03-15: 12:00:00 100 mL via INTRAVENOUS

## 2019-03-15 MED ORDER — SULFAMETHOXAZOLE-TRIMETHOPRIM 800-160 MG PO TABS
1.0000 | ORAL_TABLET | Freq: Once | ORAL | Status: AC
  Administered 2019-03-15: 14:00:00 1 via ORAL
  Filled 2019-03-15: qty 1

## 2019-03-15 NOTE — ED Notes (Signed)
Patient transported to CT 

## 2019-03-15 NOTE — ED Triage Notes (Signed)
Pt reports was here yesterday for rectal pain and started bleeding today.

## 2019-03-15 NOTE — Discharge Instructions (Addendum)
Complete the entire course of the antibiotics prescribed.  I recommend a warm epsom salt water soak for 20 minutes 2 times daily.

## 2019-03-15 NOTE — ED Notes (Addendum)
Will report off to next shift.

## 2019-03-15 NOTE — ED Provider Notes (Signed)
[facility] EMERGENCY DEPARTMENT Provider Note   CSN: [id number] Arrival date & time: 03/15/19  2094     History Chief Complaint  Patient presents with  . Rectal Bleeding    Susan Greene is a 34 y.o. female with no significant past medical history but surgical history including I&D if an advanced perirectal abscess requiring fistulectomy in 2014 presenting with perirectal pain x 4 days.  She had intercourse 4 days ago which involved accidental rectal penetration which she states was painful and has now developed bleeding from her rectum which started this morning after having a bowel movement.  She was seen here yesterday at which time she was prescribed anusol and colace but was unable to afford the anusol.  She denies fevers, chills, constipation.  Bleeding has been constant since this morning.    The history is provided by the patient.       Past Medical History:  Diagnosis Date  . Back pain   . PONV (postoperative nausea and vomiting)     There are no problems to display for this patient.   Past Surgical History:  Procedure Laterality Date  . INCISION AND DRAINAGE PERIRECTAL ABSCESS  02/25/2012   Procedure: IRRIGATION AND DEBRIDEMENT PERIRECTAL ABSCESS;  Surgeon: Dalia Heading, MD;  Location: [facility];  Service: General;  Laterality: N/A;  Fistulotomy  . KNEE ARTHROSCOPY  Right knee  . TUBAL LIGATION       OB History    Gravida  1   Para  1   Term  1   Preterm      AB      Living  1     SAB      TAB      Ectopic      Multiple      Live Births              No family history on file.  Social History   Tobacco Use  . Smoking status: Current Every [place] Smoker    Packs/[place]eyer: 0.50    Types: Cigarettes  . Smokeless tobacco: Never Used  . Tobacco comment: 1/2 pack of cigarettes per week   Substance Use Topics  . Alcohol use: Yes    Comment: couple beers on the weekend  . Drug use: No    Home Medications Prior to Admission medications    Medication Sig Start Date End Date Taking? Authorizing Provider  cyclobenzaprine (FLEXERIL) 10 MG tablet Take 1 tablet (10 mg total) by mouth 2 (two) times daily as needed for muscle spasms. Patient not taking: Reported on 03/15/2019 11/24/17   Geoffery Lyons, MD  docusate sodium (COLACE) 100 MG capsule Take 1 capsule (100 mg total) by mouth daily as needed. Patient not taking: Reported on 03/15/2019 03/14/19 03/13/20  Elson Areas, PA-C  HYDROcodone-acetaminophen (NORCO/VICODIN) 5-325 MG tablet Take 1 tablet by mouth every 4 (four) hours as needed. Patient not taking: Reported on 03/15/2019 04/30/18   Burgess Amor, PA-C  sulfamethoxazole-trimethoprim (BACTRIM DS) 800-160 MG tablet Take 1 tablet by mouth 2 (two) times daily for 10 days. 03/15/19 03/25/19  Burgess Amor, PA-C    Allergies    Patient has no known allergies.  Review of Systems   Review of Systems  Constitutional: Negative for chills and fever.  HENT: Negative for congestion and sore throat.   Eyes: Negative.   Respiratory: Negative for chest tightness and shortness of breath.   Cardiovascular: Negative for chest pain.  Gastrointestinal: Positive for anal  bleeding and rectal pain. Negative for abdominal pain, constipation, nausea and vomiting.  Genitourinary: Negative.   Musculoskeletal: Negative for arthralgias, joint swelling and neck pain.  Skin: Negative.  Negative for rash and wound.  Neurological: Negative for dizziness, weakness, light-headedness, numbness and headaches.  Psychiatric/Behavioral: Negative.     Physical Exam Updated Vital Signs BP 132/79 (BP Location: Left Arm)   Pulse (!) 106   Temp 98.9 F (37.2 C) (Oral)   Resp 18   Ht 5\' 5"  (1.651 m)   Wt 68 kg   LMP 03/06/2019   SpO2 100%   BMI 24.95 kg/m   Physical Exam Vitals and nursing note reviewed. Exam conducted with a chaperone present.  Constitutional:      Appearance: She is well-developed.  HENT:     Head: Normocephalic and atraumatic.  Eyes:      Conjunctiva/sclera: Conjunctivae normal.  Cardiovascular:     Rate and Rhythm: Normal rate and regular rhythm.     Heart sounds: Normal heart sounds.  Pulmonary:     Effort: Pulmonary effort is normal.     Breath sounds: Normal breath sounds. No wheezing.  Abdominal:     General: Bowel sounds are normal.     Palpations: Abdomen is soft.     Tenderness: There is no abdominal tenderness.  Genitourinary:    Rectum: Tenderness present. No anal fissure or external hemorrhoid.     Comments: Small sinus opening at the 6-7 o'clock position externally which has purulent/sanguinous drainage with palpation.  Tender without induration. Pt does not tolerate rectal exam secondary to pain.  Musculoskeletal:        General: Normal range of motion.     Cervical back: Normal range of motion.  Skin:    General: Skin is warm and dry.  Neurological:     Mental Status: She is alert.     ED Results / Procedures / Treatments   Labs (all labs ordered are listed, but only abnormal results are displayed) Labs Reviewed  CBC WITH DIFFERENTIAL/PLATELET  BASIC METABOLIC PANEL  POC URINE PREG, ED    EKG None  Radiology CT PELVIS W CONTRAST  Result Date: 03/15/2019 CLINICAL DATA:  Rectal pain and bleeding after injury during intercourse 2 days prior. EXAM: CT PELVIS WITH CONTRAST TECHNIQUE: Multidetector CT imaging of the pelvis was performed using the standard protocol following the bolus administration of intravenous contrast. CONTRAST:  178mL OMNIPAQUE IOHEXOL 300 MG/ML  SOLN COMPARISON:  None. FINDINGS: Urinary Tract:  Normal bladder. Bowel: Normal caliber pelvic bowel loops. There is indistinct mild soft tissue fullness in the region of the anus with a 1.8 x 1.0 x 2.2 cm posterior perianal collection (series 3/image 97 and series 9/image 99) near the midline with surrounding fat stranding and soft tissue enhancement. Vascular/Lymphatic: No acute vascular abnormality. No pathologically enlarged lymph  nodes in the pelvis. Reproductive: Grossly normal uterus. Tubal ligation clips are in place bilaterally. No adnexal mass. Other: Small volume simple free fluid in the pelvic cul-de-sac. No pneumoperitoneum in the pelvis. Musculoskeletal: No aggressive appearing focal osseous lesions. IMPRESSION: 1. Posterior perianal 1.8 x 1.0 x 2.2 cm collection near the midline with surrounding fat stranding and soft tissue fullness and enhancement, compatible with perianal abscess. 2. Small volume simple free fluid in the pelvic cul-de-sac. No pelvic pneumoperitoneum. Electronically Signed   By: Ilona Sorrel M.D.   On: 03/15/2019 12:55    Procedures Procedures (including critical care time)  Medications Ordered in ED Medications  sulfamethoxazole-trimethoprim (BACTRIM  DS) 800-160 MG per tablet 1 tablet (has no administration in time range)  iohexol (OMNIPAQUE) 300 MG/ML solution 100 mL (100 mLs Intravenous Contrast Given 03/15/19 1224)    ED Course  I have reviewed the triage vital signs and the nursing notes.  Pertinent labs & imaging results that were available during my care of the patient were reviewed by me and considered in my medical decision making (see chart for details).    MDM Rules/Calculators/A&P                      Labs and imaging reviewed and discussed with patient.  She has a perirectal abscess which is small and is spontaneously draining.  There is no evidence for rectal injury or fistula formation involving the rectal wall.  She was placed on Bactrim, discussed warm water soaks and strict return precautions for any worsening symptoms.  She was referred back to general surgery if she has persistent or worsening symptoms.  The patient appears reasonably screened and/or stabilized for discharge and I doubt any other medical condition or other [facility] requiring further screening, evaluation, or treatment in the ED at this time prior to discharge.  Final Clinical Impression(s) / ED Diagnoses  Final diagnoses:  Perirectal abscess    Rx / DC Orders ED Discharge Orders         Ordered    sulfamethoxazole-trimethoprim (BACTRIM DS) 800-160 MG tablet  2 times daily     03/15/19 1338           Burgess Amor, Cordelia Poche 03/15/19 1350    Bethann Berkshire, MD 03/16/19 1037

## 2019-06-04 ENCOUNTER — Other Ambulatory Visit: Payer: Self-pay

## 2019-06-04 ENCOUNTER — Emergency Department (HOSPITAL_COMMUNITY)
Admission: EM | Admit: 2019-06-04 | Discharge: 2019-06-04 | Disposition: A | Discharge status: Home / Self Care | Payer: Medicaid Other | Attending: Emergency Medicine | Admitting: Emergency Medicine

## 2019-06-04 ENCOUNTER — Emergency Department (HOSPITAL_COMMUNITY): Payer: Medicaid Other

## 2019-06-04 ENCOUNTER — Encounter (HOSPITAL_COMMUNITY): Payer: Self-pay

## 2019-06-04 DIAGNOSIS — F17210 Nicotine dependence, cigarettes, uncomplicated: Secondary | ICD-10-CM | POA: Diagnosis not present

## 2019-06-04 DIAGNOSIS — R110 Nausea: Secondary | ICD-10-CM | POA: Insufficient documentation

## 2019-06-04 DIAGNOSIS — G43109 Migraine with aura, not intractable, without status migrainosus: Secondary | ICD-10-CM | POA: Insufficient documentation

## 2019-06-04 LAB — POC URINE PREG, ED: Preg Test, Ur: NEGATIVE

## 2019-06-04 MED ORDER — DEXAMETHASONE SODIUM PHOSPHATE 10 MG/ML IJ SOLN
10.0000 mg | Freq: Once | INTRAMUSCULAR | Status: AC
  Administered 2019-06-04: 10 mg via INTRAVENOUS
  Filled 2019-06-04: qty 1

## 2019-06-04 MED ORDER — DIPHENHYDRAMINE HCL 50 MG/ML IJ SOLN
25.0000 mg | Freq: Once | INTRAMUSCULAR | Status: AC
  Administered 2019-06-04: 25 mg via INTRAVENOUS
  Filled 2019-06-04: qty 1

## 2019-06-04 MED ORDER — KETOROLAC TROMETHAMINE 30 MG/ML IJ SOLN
15.0000 mg | Freq: Once | INTRAMUSCULAR | Status: AC
  Administered 2019-06-04: 15 mg via INTRAVENOUS
  Filled 2019-06-04: qty 1

## 2019-06-04 MED ORDER — SODIUM CHLORIDE 0.9 % IV BOLUS
1000.0000 mL | Freq: Once | INTRAVENOUS | Status: AC
  Administered 2019-06-04: 1000 mL via INTRAVENOUS

## 2019-06-04 MED ORDER — PROCHLORPERAZINE EDISYLATE 10 MG/2ML IJ SOLN
10.0000 mg | Freq: Once | INTRAMUSCULAR | Status: AC
  Administered 2019-06-04: 10 mg via INTRAVENOUS
  Filled 2019-06-04: qty 2

## 2019-06-04 NOTE — ED Triage Notes (Signed)
Pt reports migraine for one week across forehead. Sone blurred vision

## 2019-06-04 NOTE — ED Provider Notes (Signed)
[facility] EMERGENCY DEPARTMENT Provider Note   CSN: [id number] Arrival date & time: 06/04/19  1035     History Chief Complaint  Patient presents with  . Migraine    Susan Greene is a 34 y.o. female with a history of low back pain and reported history of occasional "migraine" headache presenting with a  1 week history of escalating headache not relieved with otc medications. She endorses photophobia and blurred vision when in sunlight.  She denies fevers, chills, n/v, focal weakness, dizziness, also denies neck pain or stiffness, denies head injury. She reports poor PO intake this week secondary to headache pain and nausea.  The history is provided by the patient.       Past Medical History:  Diagnosis Date  . Back pain   . PONV (postoperative nausea and vomiting)     There are no problems to display for this patient.   Past Surgical History:  Procedure Laterality Date  . INCISION AND DRAINAGE PERIRECTAL ABSCESS  02/25/2012   Procedure: IRRIGATION AND DEBRIDEMENT PERIRECTAL ABSCESS;  Surgeon: Dalia Heading, MD;  Location: [facility];  Service: General;  Laterality: N/A;  Fistulotomy  . KNEE ARTHROSCOPY  Right knee  . TUBAL LIGATION       OB History    Gravida  1   Para  1   Term  1   Preterm      AB      Living  1     SAB      TAB      Ectopic      Multiple      Live Births              No family history on file.  Social History   Tobacco Use  . Smoking status: Current Every [place] Smoker    Packs/Lenz: 0.50    Types: Cigarettes  . Smokeless tobacco: Never Used  Substance Use Topics  . Alcohol use: Yes    Comment: couple beers on the weekend  . Drug use: No    Home Medications Prior to Admission medications   Medication Sig Start Date End Date Taking? Authorizing Provider  cyclobenzaprine (FLEXERIL) 10 MG tablet Take 1 tablet (10 mg total) by mouth 2 (two) times daily as needed for muscle spasms. Patient not taking: Reported on 03/15/2019  11/24/17   Geoffery Lyons, MD  docusate sodium (COLACE) 100 MG capsule Take 1 capsule (100 mg total) by mouth daily as needed. Patient not taking: Reported on 03/15/2019 03/14/19 03/13/20  Elson Areas, PA-C  HYDROcodone-acetaminophen (NORCO/VICODIN) 5-325 MG tablet Take 1 tablet by mouth every 4 (four) hours as needed. Patient not taking: Reported on 03/15/2019 04/30/18   Burgess Amor, PA-C    Allergies    Patient has no known allergies.  Review of Systems   Review of Systems  Constitutional: Negative for chills and fever.  HENT: Negative for congestion and sore throat.   Eyes: Negative.   Respiratory: Negative for chest tightness and shortness of breath.   Cardiovascular: Negative for chest pain.  Gastrointestinal: Positive for nausea. Negative for abdominal pain and vomiting.  Genitourinary: Negative.   Musculoskeletal: Negative for arthralgias, joint swelling, neck pain and neck stiffness.  Skin: Negative.  Negative for rash and wound.  Neurological: Positive for headaches. Negative for dizziness, weakness, light-headedness and numbness.  Psychiatric/Behavioral: Negative.     Physical Exam Updated Vital Signs BP 105/84   Pulse 65   Temp 98.2 F (36.8  C) (Oral)   Resp 16   Ht 5\' 5"  (1.651 m)   Wt 68 kg   LMP 05/17/2019   SpO2 98%   BMI 24.96 kg/m   Physical Exam Vitals and nursing note reviewed.  Constitutional:      Appearance: She is well-developed.     Comments: Uncomfortable appearing.    HENT:     Head: Normocephalic and atraumatic.     Comments: No sinus ttp.    Nose: No congestion or rhinorrhea.  Eyes:     Pupils: Pupils are equal, round, and reactive to light.  Cardiovascular:     Rate and Rhythm: Normal rate.     Heart sounds: Normal heart sounds.  Pulmonary:     Effort: Pulmonary effort is normal.  Abdominal:     Palpations: Abdomen is soft.     Tenderness: There is no abdominal tenderness.  Musculoskeletal:        General: Normal range of motion.      Cervical back: Normal range of motion and neck supple.  Lymphadenopathy:     Cervical: No cervical adenopathy.  Skin:    General: Skin is warm and dry.     Findings: No rash.  Neurological:     Mental Status: She is alert and oriented to person, place, and time.     GCS: GCS eye subscore is 4. GCS verbal subscore is 5. GCS motor subscore is 6.     Sensory: No sensory deficit.     Gait: Gait normal.     Comments: Normal heel-shin, normal rapid alternating movements. Cranial nerves III-XII intact.  No pronator drift.  Psychiatric:        Speech: Speech normal.        Behavior: Behavior normal.        Thought Content: Thought content normal.     ED Results / Procedures / Treatments   Labs (all labs ordered are listed, but only abnormal results are displayed) Labs Reviewed  POC URINE PREG, ED    EKG None  Radiology CT Head Wo Contrast  Result Date: 06/04/2019 CLINICAL DATA:  Migraine headache for 1 week, blurred vision and dizziness EXAM: CT HEAD WITHOUT CONTRAST TECHNIQUE: Contiguous axial images were obtained from the base of the skull through the vertex without intravenous contrast. COMPARISON:  None. FINDINGS: Brain: No acute infarct or hemorrhage. Lateral ventricles and midline structures are unremarkable. No acute extra-axial fluid collections. No mass effect. Vascular: No hyperdense vessel or unexpected calcification. Skull: Normal. Negative for fracture or focal lesion. Sinuses/Orbits: No acute finding. Other: None. IMPRESSION: 1. No acute intracranial process. Electronically Signed   By: 08/04/2019 M.D.   On: 06/04/2019 14:51    Procedures Procedures (including critical care time)  Medications Ordered in ED Medications  ketorolac (TORADOL) 30 MG/ML injection 15 mg (has no administration in time range)  sodium chloride 0.9 % bolus 1,000 mL (0 mLs Intravenous Stopped 06/04/19 1252)  prochlorperazine (COMPAZINE) injection 10 mg (10 mg Intravenous Given 06/04/19 1151)    diphenhydrAMINE (BENADRYL) injection 25 mg (25 mg Intravenous Given 06/04/19 1151)  dexamethasone (DECADRON) injection 10 mg (10 mg Intravenous Given 06/04/19 1151)  sodium chloride 0.9 % bolus 1,000 mL (1,000 mLs Intravenous New Bag/Given 06/04/19 1358)    ED Course  I have reviewed the triage vital signs and the nursing notes.  Pertinent labs & imaging results that were available during my care of the patient were reviewed by me and considered in my medical decision making (  see chart for details).    MDM Rules/Calculators/A&P                      Pt given migraine cocktail with mild improvement in headache pain, now 5/10 from 7/10. IV fluids ordered. Ct imaging ordered to r/o intracranial pathology. Will plan to add toradol IV if CT normal.   3:07 PM Ct imaging normal.  Toradol added, headache improving.  Pt referred to neurology for f/u testing if sx become more frequent.  Prn f/u anticipated. Final Clinical Impression(s) / ED Diagnoses Final diagnoses:  Migraine with aura and without status migrainosus, not intractable    Rx / DC Orders ED Discharge Orders    None       Landis Martins 06/04/19 1508    Davonna Belling, MD 06/04/19 [phone]

## 2019-06-04 NOTE — ED Notes (Signed)
Pt. With CT. 

## 2019-06-04 NOTE — Discharge Instructions (Signed)
Your exam and CT scans are reassuring with no abnormalities found.

## 2020-01-25 ENCOUNTER — Emergency Department (HOSPITAL_COMMUNITY)
Admission: EM | Admit: 2020-01-25 | Discharge: 2020-01-25 | Discharge status: Against Medical Advice | Payer: Medicaid Other

## 2020-01-25 ENCOUNTER — Other Ambulatory Visit: Payer: Self-pay

## 2020-01-26 ENCOUNTER — Emergency Department (HOSPITAL_COMMUNITY): Payer: Medicaid Other

## 2020-01-26 ENCOUNTER — Emergency Department (HOSPITAL_COMMUNITY)
Admission: EM | Admit: 2020-01-26 | Discharge: 2020-01-26 | Disposition: A | Discharge status: Home / Self Care | Payer: Medicaid Other | Attending: Emergency Medicine | Admitting: Emergency Medicine

## 2020-01-26 ENCOUNTER — Other Ambulatory Visit: Payer: Self-pay

## 2020-01-26 ENCOUNTER — Encounter (HOSPITAL_COMMUNITY): Payer: Self-pay | Admitting: Emergency Medicine

## 2020-01-26 DIAGNOSIS — K610 Anal abscess: Secondary | ICD-10-CM | POA: Insufficient documentation

## 2020-01-26 DIAGNOSIS — F17210 Nicotine dependence, cigarettes, uncomplicated: Secondary | ICD-10-CM | POA: Diagnosis not present

## 2020-01-26 LAB — CBC WITH DIFFERENTIAL/PLATELET
Abs Immature Granulocytes: 0.01 10*3/uL (ref 0.00–0.07)
Basophils Absolute: 0 10*3/uL (ref 0.0–0.1)
Basophils Relative: 0 %
Eosinophils Absolute: 0.1 10*3/uL (ref 0.0–0.5)
Eosinophils Relative: 2 %
HCT: 36 % (ref 36.0–46.0)
Hemoglobin: 11.7 g/dL — ABNORMAL LOW (ref 12.0–15.0)
Immature Granulocytes: 0 %
Lymphocytes Relative: 38 %
Lymphs Abs: 2.1 10*3/uL (ref 0.7–4.0)
MCH: 27.8 pg (ref 26.0–34.0)
MCHC: 32.5 g/dL (ref 30.0–36.0)
MCV: 85.5 fL (ref 80.0–100.0)
Monocytes Absolute: 0.4 10*3/uL (ref 0.1–1.0)
Monocytes Relative: 8 %
Neutro Abs: 2.9 10*3/uL (ref 1.7–7.7)
Neutrophils Relative %: 52 %
Platelets: 267 10*3/uL (ref 150–400)
RBC: 4.21 MIL/uL (ref 3.87–5.11)
RDW: 15.3 % (ref 11.5–15.5)
WBC: 5.5 10*3/uL (ref 4.0–10.5)
nRBC: 0 % (ref 0.0–0.2)

## 2020-01-26 LAB — BASIC METABOLIC PANEL
Anion gap: 10 (ref 5–15)
BUN: 12 mg/dL (ref 6–20)
CO2: 25 mmol/L (ref 22–32)
Calcium: 9.1 mg/dL (ref 8.9–10.3)
Chloride: 104 mmol/L (ref 98–111)
Creatinine, Ser: 0.78 mg/dL (ref 0.44–1.00)
GFR, Estimated: >60 mL/min (ref >60)
Glucose, Bld: 83 mg/dL (ref 70–99)
Potassium: 3.8 mmol/L (ref 3.5–5.1)
Sodium: 139 mmol/L (ref 135–145)

## 2020-01-26 LAB — HCG, QUANTITATIVE, PREGNANCY: hCG, Beta Chain, Quant, S: <1 m[IU]/mL (ref <5)

## 2020-01-26 MED ORDER — AMOXICILLIN-POT CLAVULANATE 875-125 MG PO TABS
1.0000 | ORAL_TABLET | Freq: Once | ORAL | Status: AC
  Administered 2020-01-26: 1 via ORAL
  Filled 2020-01-26: qty 1

## 2020-01-26 MED ORDER — AMOXICILLIN-POT CLAVULANATE 875-125 MG PO TABS: 1.0000 | ORAL_TABLET | Freq: Two times a day (BID) | ORAL | 0 refills | Status: DC

## 2020-01-26 MED ORDER — IOHEXOL 300 MG/ML  SOLN
100.0000 mL | Freq: Once | INTRAMUSCULAR | Status: AC | PRN
  Administered 2020-01-26: 100 mL via INTRAVENOUS

## 2020-01-26 NOTE — ED Provider Notes (Signed)
[facility] Provider Note   CSN: [id number] Arrival date & time: 01/26/20  1349     History Chief Complaint  Patient presents with  . Abscess    Susan Greene is a 34 y.o. female.  Susan Greene is a 34 y.o. female with history of previous perirectal abscess, back pain, who presents to the emergency department for evaluation of pain over the right inner buttock that she noticed about 2 weeks ago, she reports that she felt like she noticed some drainage over the past week primarily when she wipes after having a bowel movement.  Reports this feels very similar to when she had a perirectal abscess, that required surgical drainage on hospital admission previously.  She states that she has not had previous abscesses since then, no history of diabetes.  No fevers or chills, no nausea or vomiting.  She reports that pain from what she suspects is an abscess is primarily present with movement and sitting, she has been able to have bowel movements and denies pain with bowel movements but that is when she seems to notice drainage from this area which she describes as blood and pus like substance.  She has not seen anyone else for this and has not been on any antibiotics or other medications for this.  No other aggravating or alleviating factors.        Past Medical History:  Diagnosis Date  . Back pain   . PONV (postoperative nausea and vomiting)     There are no problems to display for this patient.   Past Surgical History:  Procedure Laterality Date  . INCISION AND DRAINAGE PERIRECTAL ABSCESS  02/25/2012   Procedure: IRRIGATION AND DEBRIDEMENT PERIRECTAL ABSCESS;  Surgeon: Dalia HeadingMark A Jenkins, MD;  Location: [facility];  Service: General;  Laterality: N/A;  Fistulotomy  . KNEE ARTHROSCOPY  Right knee  . TUBAL LIGATION       OB History    Gravida  1   Para  1   Term  1   Preterm      AB      Living  1     SAB      TAB      Ectopic      Multiple      Live  Births              History reviewed. No pertinent family history.  Social History   Tobacco Use  . Smoking status: Current Every Neider Smoker    Packs/Roedel: 0.50    Types: Cigarettes  . Smokeless tobacco: Never Used  . Tobacco comment: 1 to 2 cigarettes a Dyar  Vaping Use  . Vaping Use: Never used  Substance Use Topics  . Alcohol use: Yes    Alcohol/week: 10.0 standard drinks    Types: 10 Shots of liquor per week    Comment: couple beers on the weekend  . Drug use: No    Home Medications Prior to Admission medications   Medication Sig Start Date End Date Taking? Authorizing Provider  amoxicillin-clavulanate (AUGMENTIN) 875-125 MG tablet Take 1 tablet by mouth 2 (two) times daily. One po bid x 7 days 01/26/20   Dartha LodgeFord, Newton Frutiger N, PA-C  cyclobenzaprine (FLEXERIL) 10 MG tablet Take 1 tablet (10 mg total) by mouth 2 (two) times daily as needed for muscle spasms. Patient not taking: Reported on 03/15/2019 11/24/17   Geoffery Lyonselo, Douglas, MD  docusate sodium (COLACE) 100 MG capsule Take 1 capsule (100  mg total) by mouth daily as needed. Patient not taking: Reported on 03/15/2019 03/14/19 03/13/20  Elson Areas, PA-C  HYDROcodone-acetaminophen (NORCO/VICODIN) 5-325 MG tablet Take 1 tablet by mouth every 4 (four) hours as needed. Patient not taking: Reported on 03/15/2019 04/30/18   Burgess Amor, PA-C    Allergies    Patient has no known allergies.  Review of Systems   Review of Systems  Constitutional: Negative for chills and fever.  Gastrointestinal: Positive for rectal pain. Negative for abdominal pain, blood in stool, constipation, diarrhea, nausea and vomiting.  Musculoskeletal: Negative for arthralgias, back pain and myalgias.  Skin: Negative for color change and rash.  All other systems reviewed and are negative.   Physical Exam Updated Vital Signs BP 132/83 (BP Location: Right Arm)   Pulse (!) 106   Temp 98.3 F (36.8 C) (Oral)   Resp 16   Ht 5\' 5"  (1.651 m)   Wt 72.6 kg   LMP  12/31/2019 (Approximate)   SpO2 100%   BMI 26.63 kg/m   Physical Exam Vitals and nursing note reviewed.  Constitutional:      General: She is not in acute distress.    Appearance: Normal appearance. She is well-developed and normal weight. She is not ill-appearing or diaphoretic.     Comments: Well-appearing and in no distress  HENT:     Head: Normocephalic and atraumatic.     Mouth/Throat:     Mouth: Mucous membranes are moist.     Pharynx: Oropharynx is clear.  Eyes:     General:        Right eye: No discharge.        Left eye: No discharge.     Pupils: Pupils are equal, round, and reactive to light.  Cardiovascular:     Rate and Rhythm: Normal rate and regular rhythm.     Heart sounds: Normal heart sounds.  Pulmonary:     Effort: Pulmonary effort is normal. No respiratory distress.     Breath sounds: Normal breath sounds. No wheezing or rales.     Comments: Respirations equal and unlabored, patient able to speak in full sentences, lungs clear to auscultation bilaterally  Abdominal:     General: Bowel sounds are normal. There is no distension.     Palpations: Abdomen is soft. There is no mass.     Tenderness: There is no abdominal tenderness. There is no guarding.     Comments: Abdomen soft, nondistended, nontender to palpation in all quadrants without guarding or peritoneal signs   Genitourinary:    Comments: There is some tenderness and induration posterior to and surrounding the anus, there is no expressible drainage, patient with normal sphincter tone.  No external hemorrhoids noted, no fissure. Musculoskeletal:        General: No deformity.     Cervical back: Neck supple.  Skin:    General: Skin is warm and dry.     Capillary Refill: Capillary refill takes less than 2 seconds.  Neurological:     Mental Status: She is alert and oriented to person, place, and time.     Coordination: Coordination normal.     Comments: Speech is clear, able to follow commands Moves  extremities without ataxia, coordination intact  Psychiatric:        Mood and Affect: Mood normal.        Behavior: Behavior normal.     ED Results / Procedures / Treatments   Labs (all labs ordered are listed, but only  abnormal results are displayed) Labs Reviewed  CBC WITH DIFFERENTIAL/PLATELET - Abnormal; Notable for the following components:      Result Value   Hemoglobin 11.7 (*)    All other components within normal limits  BASIC METABOLIC PANEL  HCG, QUANTITATIVE, PREGNANCY    EKG None  Radiology CT PELVIS W CONTRAST  Result Date: 01/26/2020 CLINICAL DATA:  Right buttock abscess EXAM: CT PELVIS WITH CONTRAST TECHNIQUE: Multidetector CT imaging of the pelvis was performed using the standard protocol following the bolus administration of intravenous contrast. CONTRAST:  OMNIPAQUE IOHEXOL 300 MG/ML  SOLN COMPARISON:  03/15/2019 FINDINGS: Urinary Tract:  No abnormality visualized. Bowel: There is a posterior perianal gas and fluid collection measuring 2.3 x 1.6 cm image 36/2, and 2.5 cm in craniocaudal extent image 66/4, consistent with Recurrent abscess. No bowel obstruction or ileus. Normal appendix right lower quadrant. Vascular/Lymphatic: No pathologically enlarged lymph nodes. No significant vascular abnormality seen. Reproductive:  No mass or other significant abnormality Other:  No free fluid or free gas.  No abdominal wall hernia. Musculoskeletal: No acute or destructive bony lesions. IMPRESSION: 1. Posterior perianal gas and fluid collection consistent with abscess, measuring 2.3 x 1.6 x 2.5 cm. This is in the location where previous abscess was identified on 03/15/2019 CT. Electronically Signed   By: Sharlet Salina M.D.   On: 01/26/2020 16:39    Procedures Procedures (including critical care time)  Medications Ordered in ED Medications  iohexol (OMNIPAQUE) 300 MG/ML solution 100 mL (100 mLs Intravenous Contrast Given 01/26/20 1622)  amoxicillin-clavulanate  (AUGMENTIN) 875-125 MG per tablet 1 tablet (1 tablet Oral Given 01/26/20 1708)    ED Course  I have reviewed the triage vital signs and the nursing notes.  Pertinent labs & imaging results that were available during my care of the patient were reviewed by me and considered in my medical decision making (see chart for details).    MDM Rules/Calculators/A&P                         34 year old female presents with concern for abscess, she has had pain to the right buttock surrounding the anus for a few weeks and has noted some drainage primarily when she wipes after having a bowel movement that she describes as bloody and purulent.  No fevers or constitutional symptoms.  Patient is well-appearing.  She does have history of previous perianal perirectal abscess, no history of diabetes.  Prior perirectal surgery done by Dr. Lovell Sheehan.  Will get CT pelvis with contrast and basic labs.  I have independently ordered, reviewed and interpreted all labs and imaging:  CBC: No leukocytosis, stable hemoglobin BMP: No electrolyte derangements, normal renal function Pregnancy: Negative  CT pelvis: Posterior perianal gas and fluid collection consistent with abscess, measuring 2.3 x 1.6 x 2.5 cm, and location where patient had previous abscess in January.  I called and discussed CT results with Dr. Lovell Sheehan with general surgery, he feels that gas is likely due to area of drainage, may be draining internally at the dentate line, does not feel the patient needs emergent surgery, recommends treatment with Augmentin and close follow-up with him in the office.  I discussed this plan with the patient who is in agreement.  Given first dose of antibiotics and discharged home in good condition, strict return precautions provided.   Final Clinical Impression(s) / ED Diagnoses Final diagnoses:  Perianal abscess    Rx / DC Orders ED Discharge Orders  Ordered    amoxicillin-clavulanate (AUGMENTIN) 875-125 MG  tablet  2 times daily        01/26/20 1701           Dartha Lodge, [place] 01/26/20 1924    Eber Hong, MD 01/29/20 1315

## 2020-01-26 NOTE — ED Notes (Signed)
Pt reports drainage when she wipes from an abscess tp her R inner buttocks   There is no visible nor palpipal raised area that is tender to palpation   She reports a surgical removal of an abscess several years ago in the same area with a 2 days admission

## 2020-01-26 NOTE — ED Notes (Signed)
KF, PA in to discuss finding and plan of care

## 2020-01-26 NOTE — Discharge Instructions (Signed)
You have a small perianal abscess, please take antibiotics twice daily as directed, do warm soaks in the bathtub at least 3 times daily.  Please call to schedule close follow-up with Dr. Lovell Sheehan.  If you develop fevers, significantly worsened rectal pain, have trouble having bowel movements or noticed large amounts of blood or drainage you should return to the emergency department for reevaluation.

## 2020-01-26 NOTE — ED Triage Notes (Signed)
Pt c/o of a draining abscess or her right buttocks.

## 2020-01-26 NOTE — ED Notes (Signed)
Patient transported to CT 

## 2020-03-03 ENCOUNTER — Encounter (HOSPITAL_COMMUNITY): Payer: Self-pay | Admitting: Emergency Medicine

## 2020-03-03 ENCOUNTER — Emergency Department (HOSPITAL_COMMUNITY)
Admission: EM | Admit: 2020-03-03 | Discharge: 2020-03-03 | Disposition: A | Discharge status: Home / Self Care | Payer: Medicaid Other | Attending: Emergency Medicine | Admitting: Emergency Medicine

## 2020-03-03 ENCOUNTER — Other Ambulatory Visit: Payer: Self-pay

## 2020-03-03 DIAGNOSIS — Z5321 Procedure and treatment not carried out due to patient leaving prior to being seen by health care provider: Secondary | ICD-10-CM | POA: Insufficient documentation

## 2020-03-03 DIAGNOSIS — L0231 Cutaneous abscess of buttock: Secondary | ICD-10-CM | POA: Diagnosis present

## 2020-03-03 NOTE — ED Triage Notes (Signed)
Pt to the ED with complaints of an abscess on her buttock.  Pt states it has been there for a month and she has had a course of abx already.

## 2020-03-11 ENCOUNTER — Other Ambulatory Visit: Payer: Self-pay

## 2020-03-11 ENCOUNTER — Ambulatory Visit (INDEPENDENT_AMBULATORY_CARE_PROVIDER_SITE_OTHER): Payer: Medicaid Other | Admitting: General Surgery

## 2020-03-11 ENCOUNTER — Other Ambulatory Visit: Payer: Self-pay | Admitting: Family Medicine

## 2020-03-11 ENCOUNTER — Encounter: Payer: Self-pay | Admitting: General Surgery

## 2020-03-11 VITALS — BP 114/77 | HR 84 | Temp 97.6°F | Resp 16 | Ht 65.0 in | Wt 163.0 lb

## 2020-03-11 DIAGNOSIS — K610 Anal abscess: Secondary | ICD-10-CM | POA: Diagnosis not present

## 2020-03-11 MED ORDER — AMOXICILLIN-POT CLAVULANATE 875-125 MG PO TABS: 1.0000 | ORAL_TABLET | Freq: Two times a day (BID) | ORAL | 0 refills | Status: DC

## 2020-03-11 NOTE — Progress Notes (Signed)
Susan Greene; [id number]; Nov 11, 1985   HPI Patient is a 35 year old black female who was referred to my care from the emergency room for evaluation and treatment of a perirectal abscess.  Patient states she had drainage from the perianal region last month and was seen in the emergency room.  She was placed on antibiotic.  It seemed to resolve at that time but now seems to be swelling back up.  She denies any drainage at the present time.  I previously performed an I&D of a perirectal abscess in 2014.  She currently denies any fever or chills.  She denies any incontinence. Past Medical History:  Diagnosis Date  . Back pain   . PONV (postoperative nausea and vomiting)     Past Surgical History:  Procedure Laterality Date  . INCISION AND DRAINAGE PERIRECTAL ABSCESS  02/25/2012   Procedure: IRRIGATION AND DEBRIDEMENT PERIRECTAL ABSCESS;  Surgeon: Dalia Heading, MD;  Location: [facility];  Service: General;  Laterality: N/A;  Fistulotomy  . KNEE ARTHROSCOPY  Right knee  . TUBAL LIGATION      History reviewed. No pertinent family history.  No current outpatient medications on file prior to visit.   No current facility-administered medications on file prior to visit.    No Known Allergies  Social History   Substance and Sexual Activity  Alcohol Use Yes  . Alcohol/week: 10.0 standard drinks  . Types: 10 Shots of liquor per week   Comment: couple beers on the weekend    Social History   Tobacco Use  Smoking Status Current Every Swav[place] Smoker  . Packs/Ballantine: 0.50  . Types: Cigarettes  Smokeless Tobacco Never Used  Tobacco Comment   1 to 2 cigarettes a Kar[place]    Review of Systems  Constitutional: Negative.   HENT: Negative.   Eyes: Negative.   Respiratory: Negative.   Cardiovascular: Negative.   Gastrointestinal: Negative.   Genitourinary: Negative.   Musculoskeletal: Negative.   Skin: Negative.   Neurological: Negative.   Endo/Heme/Allergies: Negative.   Psychiatric/Behavioral:  Negative.     Objective   Vitals:   03/11/20 1433  BP: 114/77  Pulse: 84  Resp: 16  Temp: 97.6 F (36.4 C)  SpO2: 98%    Physical Exam Vitals reviewed.  Constitutional:      Appearance: Normal appearance. She is normal weight. She is not ill-appearing.  HENT:     Head: Normocephalic and atraumatic.  Cardiovascular:     Rate and Rhythm: Normal rate and regular rhythm.     Heart sounds: Normal heart sounds. No murmur heard. No friction rub. No gallop.   Pulmonary:     Effort: Pulmonary effort is normal. No respiratory distress.     Breath sounds: Normal breath sounds. No stridor. No wheezing, rhonchi or rales.  Genitourinary:    Comments: Fluctuant tender area at the 7 o'clock position of the anal verge.  No active drainage noted.  Erythema is present.  The area was cleaned with Betadine and arrow freeze applied.  Simple I&D was performed and a small amount of blood with purulent fluid was expressed.  Patient tolerated the procedure well.  No stool was present. Skin:    General: Skin is warm and dry.  Neurological:     Mental Status: She is alert and oriented to person, place, and time.     Assessment  Perianal abscess Plan   Patient will start Augmentin 1 pill twice a Cicio for 1 week.  She should keep the area clean  and dry.  I will see her in 1 week for follow-up.

## 2020-03-18 ENCOUNTER — Encounter: Payer: Self-pay | Admitting: General Surgery

## 2020-03-18 ENCOUNTER — Ambulatory Visit (INDEPENDENT_AMBULATORY_CARE_PROVIDER_SITE_OTHER): Payer: Medicaid Other | Admitting: General Surgery

## 2020-03-18 ENCOUNTER — Other Ambulatory Visit: Payer: Self-pay

## 2020-03-18 VITALS — BP 116/77 | HR 72 | Temp 98.4°F | Resp 12 | Ht 65.0 in | Wt 164.0 lb

## 2020-03-18 DIAGNOSIS — K610 Anal abscess: Secondary | ICD-10-CM

## 2020-03-18 NOTE — Progress Notes (Signed)
Subjective:     Susan Greene  Here for follow-up wound check. Is taking her antibiotic. Minimal drainage has been noted. She denies any fevers. Objective:    BP 116/77   Pulse 72   Temp 98.4 F (36.9 C) (Other (Comment))   Resp 12   Ht 5\' 5"  (1.651 m)   Wt 164 lb (74.4 kg)   LMP 02/29/2020 (Approximate)   SpO2 98%   BMI 27.29 kg/m   General:  alert, cooperative and no distress  Anal examination reveals healing wound with minimal purulent drainage noted. No significant induration is noted.     Assessment:    Perianal abscess, resolving    Plan:   Finish antibiotic course. No need for surgical intervention at this time. Keep wound clean. Follow-up as needed.

## 2020-06-30 ENCOUNTER — Telehealth: Payer: Self-pay | Admitting: Family Medicine

## 2020-06-30 MED ORDER — AMOXICILLIN-POT CLAVULANATE 875-125 MG PO TABS: 1.0000 | ORAL_TABLET | Freq: Two times a day (BID) | ORAL | 0 refills | Status: DC

## 2020-06-30 NOTE — Telephone Encounter (Signed)
Pt called and states that her abscess has returned and would like an antibx called in if possible. She states that she is soaking it in the tub but it is draining bloody yellow green and it is painful.  Per Dr. Lovell Sheehan ok to refill her antibx x 1. Informed patient that we would need to see her if it gets worse or recurs again where she needs an antibx. Pt verbalized understanding and med sent to pharm.

## 2020-07-24 ENCOUNTER — Other Ambulatory Visit: Payer: Self-pay

## 2020-07-24 ENCOUNTER — Ambulatory Visit (INDEPENDENT_AMBULATORY_CARE_PROVIDER_SITE_OTHER): Payer: Medicaid Other | Admitting: General Surgery

## 2020-07-24 ENCOUNTER — Encounter: Payer: Self-pay | Admitting: General Surgery

## 2020-07-24 VITALS — BP 139/88 | HR 85 | Temp 98.5°F | Resp 14 | Ht 65.0 in | Wt 162.0 lb

## 2020-07-24 DIAGNOSIS — K610 Anal abscess: Secondary | ICD-10-CM

## 2020-07-24 NOTE — H&P (Signed)
Susan Greene is an 35 y.o. female.   Chief Complaint: Recurrent perianal abscess HPI: Patient is a 35 year old black female who has undergone incision of a perianal abscess in the past who presents with a recurrent perianal abscess at the 7 o'clock position.  She has been treated intermittently over the past 5 months but the drainage persists.  She denies any incontinence.  She has had no stool draining from the wound.  Past Medical History:  Diagnosis Date  . Back pain   . PONV (postoperative nausea and vomiting)     Past Surgical History:  Procedure Laterality Date  . INCISION AND DRAINAGE PERIRECTAL ABSCESS  02/25/2012   Procedure: IRRIGATION AND DEBRIDEMENT PERIRECTAL ABSCESS;  Surgeon: Dalia Heading, MD;  Location: [facility];  Service: General;  Laterality: N/A;  Fistulotomy  . KNEE ARTHROSCOPY  Right knee  . TUBAL LIGATION      No family history on file. Social History:  reports that she has been smoking cigarettes. She has been smoking about 0.50 packs per [employer]. She has never used smokeless tobacco. She reports current alcohol use of about 10.0 standard drinks of alcohol per week. She reports that she does not use drugs.  Allergies: No Known Allergies  No medications prior to admission.    No results found for this or any previous visit (from the past 48 hour(s)). No results found.  Review of Systems  Constitutional: Negative.   HENT: Negative.   Eyes: Negative.   Respiratory: Negative.   Cardiovascular: Negative.   Gastrointestinal: Positive for anal bleeding.  Endocrine: Negative.   Genitourinary: Negative.   Musculoskeletal: Negative.   Skin: Negative.   Allergic/Immunologic: Negative.   Neurological: Negative.   Hematological: Negative.   Psychiatric/Behavioral: Negative.     There were no vitals taken for this visit. Physical Exam Vitals reviewed.  Constitutional:      Appearance: Normal appearance. She is normal weight. She is not ill-appearing.  HENT:      Head: Normocephalic and atraumatic.  Cardiovascular:     Rate and Rhythm: Normal rate and regular rhythm.     Heart sounds: Normal heart sounds. No murmur heard. No friction rub. No gallop.   Pulmonary:     Effort: Pulmonary effort is normal. No respiratory distress.     Breath sounds: Normal breath sounds. No stridor. No wheezing, rhonchi or rales.  Genitourinary:    Comments: Persistent granulomatous wound at the 7 o'clock position just outside the anal verge.  No purulent drainage is present.  Induration is present deep to the wound. Skin:    General: Skin is warm and dry.  Neurological:     Mental Status: She is alert and oriented to person, place, and time.      Assessment/Plan Impression: Recurrent perianal abscess Plan: Patient is scheduled for incision and drainage of a perianal abscess on 08/01/2020.  The risks and benefits of the procedure including bleeding, infection, the finding of a fistulous tract, and recurrence of the perianal abscess were fully explained to the patient, who gave informed consent.  Susan Macho, MD 07/24/2020, 11:26 AM

## 2020-07-24 NOTE — Progress Notes (Signed)
Subjective:     Susan Greene  Patient presents back with recurrent drainage from her perianal abscess.  It started several weeks ago.  She was on a round of antibiotics.  She has finished it.  She still has intermittent blood on the toilet paper when she wipes herself.  Occasional pain is noted in the perianal region.  She denies any fevers.  No stool or air has passed.  She denies any incontinence. Objective:    BP 139/88   Pulse 85   Temp 98.5 F (36.9 C) (Other (Comment))   Resp 14   Ht 5\' 5"  (1.651 m)   Wt 162 lb (73.5 kg)   SpO2 98%   BMI 26.96 kg/m   General:  alert, cooperative and no distress  Perianal wound with induration and opening of the skin and granulomatous tissue present at the 7 o'clock position.  No purulent drainage noted at the present time.     Assessment:    Recurrent perianal abscess   No obvious fistula noted.   Plan:   Patient is scheduled for incision and drainage of the perianal abscess on 08/01/2020.  The risks and benefits of the procedure including bleeding, infection, and recurrence of the perianal abscess were fully explained to the patient, who gave informed consent.

## 2020-07-28 NOTE — Patient Instructions (Signed)
Susan Greene  07/28/2020     @PREFPERIOPPHARMACY @   Your procedure is scheduled on  6/0/2022.   Report to [facility] at  0845  A.M.   Call this number if you have problems the morning of surgery:  [phone]   Remember:  Do not eat or drink after midnight.                        Take these medicines the morning of surgery with A SIP OF WATER  None     Please brush your teeth.  Do not wear jewelry, make-up or nail polish.  Do not wear lotions, powders, or perfumes, or deodorant.  Do not shave 48 hours prior to surgery.  Men may shave face and neck.  Do not bring valuables to the hospital.  [facility] is not responsible for any belongings or valuables.  Contacts, dentures or bridgework may not be worn into surgery.  Leave your suitcase in the car.  After surgery it may be brought to your room.  For patients admitted to the hospital, discharge time will be determined by your treatment team.  Patients discharged the [place] of surgery will not be allowed to drive home and must  Have someone with them for 24 hours.    Special instructions:  DO NOT smoke tobacco or vape for 24 hours before your procedure.  Please read over the following fact sheets that you were given. Coughing and Deep Breathing, Surgical Site Infection Prevention, Anesthesia Post-op Instructions and Care and Recovery After Surgery       Incision and Drainage, Care After This sheet gives you information about how to care for yourself after your procedure. Your health care provider may also give you more specific instructions. If you have problems or questions, contact your health care provider. What can I expect after the procedure? After the procedure, it is common to have:  Pain or discomfort around the incision site.  Blood, fluid, or pus (drainage) from the incision.  Redness and firm skin around the incision site. Follow these instructions at home: Medicines  Take over-the-counter  and prescription medicines only as told by your health care provider.  If you were prescribed an antibiotic medicine, use or take it as told by your health care provider. Do not stop using the antibiotic even if you start to feel better. Wound care Follow instructions from your health care provider about how to take care of your wound. Make sure you:  Wash your hands with soap and water before and after you change your bandage (dressing). If soap and water are not available, use hand sanitizer.  Change your dressing and packing as told by your health care provider. ? If your dressing is dry or stuck when you try to remove it, moisten or wet the dressing with saline or water so that it can be removed without harming your skin or tissues. ? If your wound is packed, leave it in place until your health care provider tells you to remove it. To remove the packing, moisten or wet the packing with saline or water so that it can be removed without harming your skin or tissues.  Leave stitches (sutures), skin glue, or adhesive strips in place. These skin closures may need to stay in place for 2 weeks or longer. If adhesive strip edges start to loosen and curl up, you may trim the loose edges. Do  not remove adhesive strips completely unless your health care provider tells you to do that. Check your wound every [place] for signs of infection. Check for:  More redness, swelling, or pain.  More fluid or blood.  Warmth.  Pus or a bad smell. If you were sent home with a drain tube in place, follow instructions from your health care provider about:  How to empty it.  How to care for it at home.   General instructions  Rest the affected area.  Do not take baths, swim, or use a hot tub until your health care provider approves. Ask your health care provider if you may take showers. You may only be allowed to take sponge baths.  Return to your normal activities as told by your health care provider. Ask your  health care provider what activities are safe for you. Your health care provider may put you on activity or lifting restrictions.  The incision will continue to drain. It is normal to have some clear or slightly bloody drainage. The amount of drainage should lessen each [place].  Do not apply any creams, ointments, or liquids unless you have been told to by your health care provider.  Keep all follow-up visits as told by your health care provider. This is important. Contact a health care provider if:  Your cyst or abscess returns.  You have a fever or chills.  You have more redness, swelling, or pain around your incision.  You have more fluid or blood coming from your incision.  Your incision feels warm to the touch.  You have pus or a bad smell coming from your incision.  You have red streaks above or below the incision site. Get help right away if:  You have severe pain or bleeding.  You cannot eat or drink without vomiting.  You have decreased urine output.  You become short of breath.  You have chest pain.  You cough up blood.  The affected area becomes numb or starts to tingle. These symptoms may represent a serious problem that is an emergency. Do not wait to see if the symptoms will go away. Get medical help right away. Call your local emergency services (911 in the [place] Do not drive yourself to the hospital. Summary  After this procedure, it is common to have fluid, blood, or pus coming from the surgery site.  Follow all home care instructions. You will be told how to take care of your incision, how to check for infection, and how to take medicines.  If you were prescribed an antibiotic medicine, take it as told by your health care provider. Do not stop taking the antibiotic even if you start to feel better.  Contact a health care provider if you have increased redness, swelling, or pain around your incision. Get help right away if you have chest pain, you vomit,  you cough up blood, or you have shortness of breath.  Keep all follow-up visits as told by your health care provider. This is important. This information is not intended to replace advice given to you by your health care provider. Make sure you discuss any questions you have with your health care provider. Document Revised: 01/09/2018 Document Reviewed: 01/09/2018 Elsevier Patient Education  2021 Elsevier Inc.  General Anesthesia, Adult, Care After This sheet gives you information about how to care for yourself after your procedure. Your health care provider may also give you more specific instructions. If you have problems or questions, contact your health care  provider. What can I expect after the procedure? After the procedure, the following side effects are common:  Pain or discomfort at the IV site.  Nausea.  Vomiting.  Sore throat.  Trouble concentrating.  Feeling cold or chills.  Feeling weak or tired.  Sleepiness and fatigue.  Soreness and body aches. These side effects can affect parts of the body that were not involved in surgery. Follow these instructions at home: For the time period you were told by your health care provider:  Rest.  Do not participate in activities where you could fall or become injured.  Do not drive or use machinery.  Do not drink alcohol.  Do not take sleeping pills or medicines that cause drowsiness.  Do not make important decisions or sign legal documents.  Do not take care of children on your own.   Eating and drinking  Follow any instructions from your health care provider about eating or drinking restrictions.  When you feel hungry, start by eating small amounts of foods that are soft and easy to digest (bland), such as toast. Gradually return to your regular diet.  Drink enough fluid to keep your urine pale yellow.  If you vomit, rehydrate by drinking water, juice, or clear broth. General instructions  If you have sleep  apnea, surgery and certain medicines can increase your risk for breathing problems. Follow instructions from your health care provider about wearing your sleep device: ? Anytime you are sleeping, including during daytime naps. ? While taking prescription pain medicines, sleeping medicines, or medicines that make you drowsy.  Have a responsible adult stay with you for the time you are told. It is important to have someone help care for you until you are awake and alert.  Return to your normal activities as told by your health care provider. Ask your health care provider what activities are safe for you.  Take over-the-counter and prescription medicines only as told by your health care provider.  If you smoke, do not smoke without supervision.  Keep all follow-up visits as told by your health care provider. This is important. Contact a health care provider if:  You have nausea or vomiting that does not get better with medicine.  You cannot eat or drink without vomiting.  You have pain that does not get better with medicine.  You are unable to pass urine.  You develop a skin rash.  You have a fever.  You have redness around your IV site that gets worse. Get help right away if:  You have difficulty breathing.  You have chest pain.  You have blood in your urine or stool, or you vomit blood. Summary  After the procedure, it is common to have a sore throat or nausea. It is also common to feel tired.  Have a responsible adult stay with you for the time you are told. It is important to have someone help care for you until you are awake and alert.  When you feel hungry, start by eating small amounts of foods that are soft and easy to digest (bland), such as toast. Gradually return to your regular diet.  Drink enough fluid to keep your urine pale yellow.  Return to your normal activities as told by your health care provider. Ask your health care provider what activities are safe for  you. This information is not intended to replace advice given to you by your health care provider. Make sure you discuss any questions you have with your health care  provider. Document Revised: 10/25/2019 Document Reviewed: 05/24/2019 Elsevier Patient Education  2021 Elsevier Inc. How to Use Chlorhexidine for Bathing Chlorhexidine gluconate (CHG) is a germ-killing (antiseptic) solution that is used to clean the skin. It can get rid of the bacteria that normally live on the skin and can keep them away for about 24 hours. To clean your skin with CHG, you may be given:  A CHG solution to use in the shower or as part of a sponge bath.  A prepackaged cloth that contains CHG. Cleaning your skin with CHG may help lower the risk for infection:  While you are staying in the intensive care unit of the hospital.  If you have a vascular access, such as a central line, to provide short-term or long-term access to your veins.  If you have a catheter to drain urine from your bladder.  If you are on a ventilator. A ventilator is a machine that helps you breathe by moving air in and out of your lungs.  After surgery. What are the risks? Risks of using CHG include:  A skin reaction.  Hearing loss, if CHG gets in your ears.  Eye injury, if CHG gets in your eyes and is not rinsed out.  The CHG product catching fire. Make sure that you avoid smoking and flames after applying CHG to your skin. Do not use CHG:  If you have a chlorhexidine allergy or have previously reacted to chlorhexidine.  On babies younger than 23 months of age. How to use CHG solution  Use CHG only as told by your health care provider, and follow the instructions on the label.  Use the full amount of CHG as directed. Usually, this is one bottle. During a shower Follow these steps when using CHG solution during a shower (unless your health care provider gives you different instructions): 1. Start the shower. 2. Use your  normal soap and shampoo to wash your face and hair. 3. Turn off the shower or move out of the shower stream. 4. Pour the CHG onto a clean washcloth. Do not use any type of brush or rough-edged sponge. 5. Starting at your neck, lather your body down to your toes. Make sure you follow these instructions: ? If you will be having surgery, pay special attention to the part of your body where you will be having surgery. Scrub this area for at least 1 minute. ? Do not use CHG on your head or face. If the solution gets into your ears or eyes, rinse them well with water. ? Avoid your genital area. ? Avoid any areas of skin that have broken skin, cuts, or scrapes. ? Scrub your back and under your arms. Make sure to wash skin folds. 6. Let the lather sit on your skin for 1-2 minutes or as long as told by your health care provider. 7. Thoroughly rinse your entire body in the shower. Make sure that all body creases and crevices are rinsed well. 8. Dry off with a clean towel. Do not put any substances on your body afterward--such as powder, lotion, or perfume--unless you are told to do so by your health care provider. Only use lotions that are recommended by the manufacturer. 9. Put on clean clothes or pajamas. 10. If it is the night before your surgery, sleep in clean sheets.   During a sponge bath Follow these steps when using CHG solution during a sponge bath (unless your health care provider gives you different instructions): 1. Use your  normal soap and shampoo to wash your face and hair. 2. Pour the CHG onto a clean washcloth. 3. Starting at your neck, lather your body down to your toes. Make sure you follow these instructions: ? If you will be having surgery, pay special attention to the part of your body where you will be having surgery. Scrub this area for at least 1 minute. ? Do not use CHG on your head or face. If the solution gets into your ears or eyes, rinse them well with water. ? Avoid your  genital area. ? Avoid any areas of skin that have broken skin, cuts, or scrapes. ? Scrub your back and under your arms. Make sure to wash skin folds. 4. Let the lather sit on your skin for 1-2 minutes or as long as told by your health care provider. 5. Using a different clean, wet washcloth, thoroughly rinse your entire body. Make sure that all body creases and crevices are rinsed well. 6. Dry off with a clean towel. Do not put any substances on your body afterward--such as powder, lotion, or perfume--unless you are told to do so by your health care provider. Only use lotions that are recommended by the manufacturer. 7. Put on clean clothes or pajamas. 8. If it is the night before your surgery, sleep in clean sheets. How to use CHG prepackaged cloths  Only use CHG cloths as told by your health care provider, and follow the instructions on the label.  Use the CHG cloth on clean, dry skin.  Do not use the CHG cloth on your head or face unless your health care provider tells you to.  When washing with the CHG cloth: ? Avoid your genital area. ? Avoid any areas of skin that have broken skin, cuts, or scrapes. Before surgery Follow these steps when using a CHG cloth to clean before surgery (unless your health care provider gives you different instructions): 1. Using the CHG cloth, vigorously scrub the part of your body where you will be having surgery. Scrub using a back-and-forth motion for 3 minutes. The area on your body should be completely wet with CHG when you are done scrubbing. 2. Do not rinse. Discard the cloth and let the area air-dry. Do not put any substances on the area afterward, such as powder, lotion, or perfume. 3. Put on clean clothes or pajamas. 4. If it is the night before your surgery, sleep in clean sheets.   For general bathing Follow these steps when using CHG cloths for general bathing (unless your health care provider gives you different instructions). 1. Use a  separate CHG cloth for each area of your body. Make sure you wash between any folds of skin and between your fingers and toes. Wash your body in the following order, switching to a new cloth after each step: ? The front of your neck, shoulders, and chest. ? Both of your arms, under your arms, and your hands. ? Your stomach and groin area, avoiding the genitals. ? Your right leg and foot. ? Your left leg and foot. ? The back of your neck, your back, and your buttocks. 2. Do not rinse. Discard the cloth and let the area air-dry. Do not put any substances on your body afterward--such as powder, lotion, or perfume--unless you are told to do so by your health care provider. Only use lotions that are recommended by the manufacturer. 3. Put on clean clothes or pajamas. Contact a health care provider if:  Your skin  gets irritated after scrubbing.  You have questions about using your solution or cloth. Get help right away if:  Your eyes become very red or swollen.  Your eyes itch badly.  Your skin itches badly and is red or swollen.  Your hearing changes.  You have trouble seeing.  You have swelling or tingling in your mouth or throat.  You have trouble breathing.  You swallow any chlorhexidine. Summary  Chlorhexidine gluconate (CHG) is a germ-killing (antiseptic) solution that is used to clean the skin. Cleaning your skin with CHG may help to lower your risk for infection.  You may be given CHG to use for bathing. It may be in a bottle or in a prepackaged cloth to use on your skin. Carefully follow your health care provider's instructions and the instructions on the product label.  Do not use CHG if you have a chlorhexidine allergy.  Contact your health care provider if your skin gets irritated after scrubbing. This information is not intended to replace advice given to you by your health care provider. Make sure you discuss any questions you have with your health care  provider. Document Revised: 07/27/2019 Document Reviewed: 07/27/2019 Elsevier Patient Education  2021 [employer].

## 2020-07-29 ENCOUNTER — Other Ambulatory Visit: Payer: Self-pay

## 2020-07-29 ENCOUNTER — Encounter (HOSPITAL_COMMUNITY)
Admission: RE | Admit: 2020-07-29 | Discharge: 2020-07-29 | Disposition: A | Discharge status: Home / Self Care | Payer: Medicaid Other | Source: Ambulatory Visit | Attending: General Surgery | Admitting: General Surgery

## 2020-07-29 DIAGNOSIS — Z01818 Encounter for other preprocedural examination: Secondary | ICD-10-CM | POA: Insufficient documentation

## 2020-07-29 LAB — PREGNANCY, URINE: Preg Test, Ur: NEGATIVE

## 2020-08-01 ENCOUNTER — Encounter (HOSPITAL_COMMUNITY)
Admission: RE | Disposition: A | Discharge status: Home / Self Care | Payer: Self-pay | Source: Home / Self Care | Attending: General Surgery

## 2020-08-01 ENCOUNTER — Ambulatory Visit (HOSPITAL_COMMUNITY): Payer: Medicaid Other | Admitting: Certified Registered"

## 2020-08-01 ENCOUNTER — Ambulatory Visit (HOSPITAL_COMMUNITY)
Admission: RE | Admit: 2020-08-01 | Discharge: 2020-08-01 | Disposition: A | Discharge status: Home / Self Care | Payer: Medicaid Other | Attending: General Surgery | Admitting: General Surgery

## 2020-08-01 ENCOUNTER — Encounter (HOSPITAL_COMMUNITY): Payer: Self-pay | Admitting: General Surgery

## 2020-08-01 DIAGNOSIS — F17210 Nicotine dependence, cigarettes, uncomplicated: Secondary | ICD-10-CM | POA: Insufficient documentation

## 2020-08-01 DIAGNOSIS — K603 Anal fistula, unspecified: Secondary | ICD-10-CM

## 2020-08-01 HISTORY — PX: INCISION AND DRAINAGE PERIRECTAL ABSCESS: SHX1804

## 2020-08-01 SURGERY — INCISION AND DRAINAGE, ABSCESS, PERIRECTAL
Anesthesia: General | Site: Anus

## 2020-08-01 MED ORDER — CHLORHEXIDINE GLUCONATE CLOTH 2 % EX PADS: 6.0000 | MEDICATED_PAD | Freq: Once | CUTANEOUS | Status: DC

## 2020-08-01 MED ORDER — ORAL CARE MOUTH RINSE: 15.0000 mL | Freq: Once | OROMUCOSAL | Status: AC

## 2020-08-01 MED ORDER — ONDANSETRON HCL 4 MG/2ML IJ SOLN: 4.0000 mg | Freq: Once | INTRAMUSCULAR | Status: DC | PRN

## 2020-08-01 MED ORDER — BUPIVACAINE HCL (PF) 0.5 % IJ SOLN
INTRAMUSCULAR | Status: AC
  Filled 2020-08-01: qty 30

## 2020-08-01 MED ORDER — ONDANSETRON HCL 4 MG/2ML IJ SOLN
INTRAMUSCULAR | Status: DC | PRN
  Administered 2020-08-01: 4 mg via INTRAVENOUS

## 2020-08-01 MED ORDER — MIDAZOLAM HCL 5 MG/5ML IJ SOLN
INTRAMUSCULAR | Status: DC | PRN
  Administered 2020-08-01: 2 mg via INTRAVENOUS

## 2020-08-01 MED ORDER — 0.9 % SODIUM CHLORIDE (POUR BTL) OPTIME
TOPICAL | Status: DC | PRN
  Administered 2020-08-01: 1000 mL

## 2020-08-01 MED ORDER — KETOROLAC TROMETHAMINE 30 MG/ML IJ SOLN
INTRAMUSCULAR | Status: AC
  Filled 2020-08-01: qty 1

## 2020-08-01 MED ORDER — PROPOFOL 10 MG/ML IV BOLUS
INTRAVENOUS | Status: AC
  Filled 2020-08-01: qty 20

## 2020-08-01 MED ORDER — KETOROLAC TROMETHAMINE 30 MG/ML IJ SOLN: 30.0000 mg | Freq: Once | INTRAMUSCULAR | Status: DC

## 2020-08-01 MED ORDER — LIDOCAINE VISCOUS HCL 2 % MT SOLN
OROMUCOSAL | Status: DC | PRN
  Administered 2020-08-01: 20 mL

## 2020-08-01 MED ORDER — BUPIVACAINE HCL (PF) 0.5 % IJ SOLN
INTRAMUSCULAR | Status: DC | PRN
  Administered 2020-08-01: 4 mL

## 2020-08-01 MED ORDER — LACTATED RINGERS IV SOLN
INTRAVENOUS | Status: DC
  Administered 2020-08-01: 1000 mL via INTRAVENOUS

## 2020-08-01 MED ORDER — FENTANYL CITRATE (PF) 100 MCG/2ML IJ SOLN
INTRAMUSCULAR | Status: AC
  Filled 2020-08-01: qty 2

## 2020-08-01 MED ORDER — MEPERIDINE HCL 50 MG/ML IJ SOLN: 6.2500 mg | INTRAMUSCULAR | Status: DC | PRN

## 2020-08-01 MED ORDER — CHLORHEXIDINE GLUCONATE 0.12 % MT SOLN
15.0000 mL | Freq: Once | OROMUCOSAL | Status: AC
  Administered 2020-08-01: 15 mL via OROMUCOSAL

## 2020-08-01 MED ORDER — LIDOCAINE HCL (CARDIAC) PF 100 MG/5ML IV SOSY
PREFILLED_SYRINGE | INTRAVENOUS | Status: DC | PRN
  Administered 2020-08-01: 50 mg via INTRAVENOUS

## 2020-08-01 MED ORDER — HYDROCODONE-ACETAMINOPHEN 5-325 MG PO TABS: 1.0000 | ORAL_TABLET | ORAL | 0 refills | Status: DC | PRN

## 2020-08-01 MED ORDER — MIDAZOLAM HCL 2 MG/2ML IJ SOLN
INTRAMUSCULAR | Status: AC
  Filled 2020-08-01: qty 2

## 2020-08-01 MED ORDER — LIDOCAINE HCL URETHRAL/MUCOSAL 2 % EX GEL
CUTANEOUS | Status: AC
  Filled 2020-08-01: qty 10

## 2020-08-01 MED ORDER — DEXAMETHASONE SODIUM PHOSPHATE 10 MG/ML IJ SOLN
INTRAMUSCULAR | Status: AC
  Filled 2020-08-01: qty 1

## 2020-08-01 MED ORDER — LIDOCAINE HCL (PF) 2 % IJ SOLN
INTRAMUSCULAR | Status: AC
  Filled 2020-08-01: qty 5

## 2020-08-01 MED ORDER — HYDROMORPHONE HCL 1 MG/ML IJ SOLN
0.2500 mg | INTRAMUSCULAR | Status: DC | PRN
  Administered 2020-08-01 (×2): 0.5 mg via INTRAVENOUS
  Filled 2020-08-01 (×2): qty 0.5

## 2020-08-01 MED ORDER — FENTANYL CITRATE (PF) 100 MCG/2ML IJ SOLN
INTRAMUSCULAR | Status: DC | PRN
  Administered 2020-08-01: 100 ug via INTRAVENOUS

## 2020-08-01 MED ORDER — LIDOCAINE VISCOUS HCL 2 % MT SOLN
OROMUCOSAL | Status: AC
  Filled 2020-08-01: qty 15

## 2020-08-01 MED ORDER — HEMOSTATIC AGENTS (NO CHARGE) OPTIME
TOPICAL | Status: DC | PRN
  Administered 2020-08-01: 1

## 2020-08-01 MED ORDER — ONDANSETRON HCL 4 MG/2ML IJ SOLN
INTRAMUSCULAR | Status: AC
  Filled 2020-08-01: qty 2

## 2020-08-01 MED ORDER — SODIUM CHLORIDE 0.9 % IV SOLN
1.0000 g | INTRAVENOUS | Status: AC
  Administered 2020-08-01: 1 g via INTRAVENOUS
  Filled 2020-08-01: qty 1

## 2020-08-01 MED ORDER — DEXAMETHASONE SODIUM PHOSPHATE 10 MG/ML IJ SOLN
INTRAMUSCULAR | Status: DC | PRN
  Administered 2020-08-01: 10 mg via INTRAVENOUS

## 2020-08-01 MED ORDER — PROPOFOL 10 MG/ML IV BOLUS
INTRAVENOUS | Status: DC | PRN
  Administered 2020-08-01: 200 mg via INTRAVENOUS

## 2020-08-01 MED ORDER — KETOROLAC TROMETHAMINE 30 MG/ML IJ SOLN
INTRAMUSCULAR | Status: DC | PRN
  Administered 2020-08-01: 30 mg via INTRAVENOUS

## 2020-08-01 SURGICAL SUPPLY — 27 items
BLADE 11 SAFETY STRL DISP (BLADE) ×2 IMPLANT
CANNULA VESSEL 3MM 2 BLNT TIP (CANNULA) ×2 IMPLANT
CLOTH BEACON ORANGE TIMEOUT ST (SAFETY) ×2 IMPLANT
COVER LIGHT HANDLE STERIS (MISCELLANEOUS) ×4 IMPLANT
COVER WAND RF STERILE (DRAPES) ×2 IMPLANT
DECANTER SPIKE VIAL GLASS SM (MISCELLANEOUS) ×2 IMPLANT
DRAPE HALF SHEET 40X57 (DRAPES) ×2 IMPLANT
ELECT REM PT RETURN 9FT ADLT (ELECTROSURGICAL) ×2
ELECTRODE REM PT RTRN 9FT ADLT (ELECTROSURGICAL) ×1 IMPLANT
GAUZE SPONGE 4X4 12PLY STRL (GAUZE/BANDAGES/DRESSINGS) ×4 IMPLANT
GLOVE SURG SS PI 7.5 STRL IVOR (GLOVE) ×2 IMPLANT
GLOVE SURG UNDER POLY LF SZ7 (GLOVE) ×4 IMPLANT
GOWN STRL REUS W/TWL LRG LVL3 (GOWN DISPOSABLE) ×4 IMPLANT
HEMOSTAT SURGICEL 4X8 (HEMOSTASIS) ×2 IMPLANT
KIT TURNOVER KIT A (KITS) ×2 IMPLANT
MANIFOLD NEPTUNE II (INSTRUMENTS) ×2 IMPLANT
NEEDLE HYPO 25X1 1.5 SAFETY (NEEDLE) ×2 IMPLANT
NS IRRIG 1000ML POUR BTL (IV SOLUTION) ×2 IMPLANT
PACK PERI GYN (CUSTOM PROCEDURE TRAY) ×2 IMPLANT
PAD ARMBOARD 7.5X6 YLW CONV (MISCELLANEOUS) ×2 IMPLANT
SET BASIN LINEN APH (SET/KITS/TRAYS/PACK) ×2 IMPLANT
SPONGE GAUZE 4X4 12PLY (GAUZE/BANDAGES/DRESSINGS) ×2 IMPLANT
SUT CHROMIC 2 0 SH (SUTURE) ×4 IMPLANT
SUT SILK 0 CT 1 30 (SUTURE) ×2 IMPLANT
SYR 10ML LL (SYRINGE) ×2 IMPLANT
SYR CONTROL 10ML LL (SYRINGE) ×2 IMPLANT
TOWEL OR 17X26 4PK STRL BLUE (TOWEL DISPOSABLE) IMPLANT

## 2020-08-01 NOTE — Op Note (Signed)
Patient:  Susan Greene  DOB:  September 18, 1985  MRN:  [id number]   Preop Diagnosis: Perianal fistula  Postop Diagnosis: Same  Procedure: Anal fistulectomy  Surgeon: Franky Macho, MD  Anes: General  Indications: Patient is a 35 year old black female who presents with a chronically draining wound just outside the anal verge.  The risks and benefits of the procedure including bleeding, infection, and the possibility of recurrence of the fistula were fully explained to the patient, who gave informed consent.  Procedure note: The patient was placed in the lithotomy position after general anesthesia was administered.  The perineum was prepped and draped using the usual sterile technique with Betadine.  Surgical site confirmation was performed.  On anoscopy, no hemorrhoidal disease was noted.  She had a creamy white fistulous opening at the 7 o'clock position outside the anal verge.  I did probe this and it did track towards the midline and appeared to be through the external sphincter, but there was no entrance into the rectum.  I could see where the previous fistulous tract had formed.  Granulation tissue was present.  I then elected to proceed with the fistulectomy.  The opening was excised and I did curette out the fistulous tract.  I also instilled hydrogen peroxide.  Any bleeding was controlled using Bovie electrocautery.  At the dentate line, some granulation tissue was excised and the mucosa was reapproximated using a 2-0 Chromic Gut suture.  Likewise, the external opening was reapproximated using 2-0 Chromic Gut interrupted sutures.  No purulent drainage was present.  0.5% Sensorcaine was instilled into the surrounding wound.  Surgicel and viscous Xylocaine rectal packing was then placed.  All tape and needle counts were correct at the end of the procedure.  The patient was awakened and transferred to PACU in stable condition.  Complications: None  EBL: Minimal  Specimen: None

## 2020-08-01 NOTE — Interval H&P Note (Signed)
History and Physical Interval Note:  08/01/2020 9:26 AM  Susan Greene  has presented today for surgery, with the diagnosis of Perianal abscess.  The various methods of treatment have been discussed with the patient and family. After consideration of risks, benefits and other options for treatment, the patient has consented to  Procedure(s): IRRIGATION AND DEBRIDEMENT PERIANAL ABSCESS (N/A) as a surgical intervention.  The patient's history has been reviewed, patient examined, no change in status, stable for surgery.  I have reviewed the patient's chart and labs.  Questions were answered to the patient's satisfaction.     Franky Macho

## 2020-08-01 NOTE — Progress Notes (Signed)
Please excuse Susan Greene from work from Friday August 01, 2020 through Sunday August 03, 2020. She cannot drive , operate machinery or sign legal documents for 24 hour from this time or if taken certain medications. She may return to work on Monday August 04, 2020 as long as not requiring certain medications.

## 2020-08-01 NOTE — Transfer of Care (Signed)
Immediate Anesthesia Transfer of Care Note  Patient: Susan Greene  Procedure(s) Performed: Fistulectomy of Anal (Anus)  Patient Location: PACU  Anesthesia Type:General  Level of Consciousness: awake, alert , oriented and patient cooperative  Airway & Oxygen Therapy: Patient Spontanous Breathing  Post-op Assessment: Report given to RN, Post -op Vital signs reviewed and stable and Patient moving all extremities X 4  Post vital signs: Reviewed and stable  Last Vitals:  Vitals Value Taken Time  BP    Temp    Pulse    Resp    SpO2      Last Pain:  Vitals:   08/01/20 0842  TempSrc: Oral  PainSc: 0-No pain      Patients Stated Pain Goal: 5 (08/01/20 0842)  Complications: No notable events documented.

## 2020-08-01 NOTE — Anesthesia Procedure Notes (Signed)
Procedure Name: LMA Insertion Date/Time: 08/01/2020 10:23 AM Performed by: Shanon Payor, CRNA Pre-anesthesia Checklist: Patient identified, Patient being monitored, Emergency Drugs available, Timeout performed and Suction available Patient Re-evaluated:Patient Re-evaluated prior to induction Oxygen Delivery Method: Circle System Utilized Preoxygenation: Pre-oxygenation with 100% oxygen Induction Type: IV induction Ventilation: Mask ventilation without difficulty LMA: LMA inserted LMA Size: 3.0 Number of attempts: 1 Placement Confirmation: positive ETCO2 and breath sounds checked- equal and bilateral Tube secured with: Tape Dental Injury: Teeth and Oropharynx as per pre-operative assessment

## 2020-08-01 NOTE — Anesthesia Preprocedure Evaluation (Signed)
Anesthesia Evaluation  Patient identified by MRN, date of birth, ID band Patient awake    Reviewed: Allergy & Precautions, NPO status , Patient's Chart, lab work & pertinent test results  History of Anesthesia Complications (+) PONV and history of anesthetic complications  Airway Mallampati: II  TM Distance: >3 FB Neck ROM: Full   Comment: Neck pain? Dental  (+) Dental Advisory Given, Teeth Intact   Pulmonary Current Smoker and Patient abstained from smoking.,    Pulmonary exam normal breath sounds clear to auscultation       Cardiovascular Exercise Tolerance: Good Normal cardiovascular exam Rhythm:Regular Rate:Normal     Neuro/Psych  Neuromuscular disease (bilateral carpal tunnel) negative psych ROS   GI/Hepatic negative GI ROS, (+)     substance abuse  cocaine use,   Endo/Other  negative endocrine ROS  Renal/GU negative Renal ROS     Musculoskeletal  (+) Arthritis  (back pain, neck pain?),   Abdominal   Peds  Hematology negative hematology ROS (+)   Anesthesia Other Findings   Reproductive/Obstetrics negative OB ROS                            Anesthesia Physical Anesthesia Plan  ASA: 2  Anesthesia Plan: General   Post-op Pain Management:    Induction: Intravenous  PONV Risk Score and Plan: 4 or greater and Ondansetron, Dexamethasone and Midazolam  Airway Management Planned: LMA  Additional Equipment:   Intra-op Plan:   Post-operative Plan: Extubation in OR  Informed Consent: I have reviewed the patients History and Physical, chart, labs and discussed the procedure including the risks, benefits and alternatives for the proposed anesthesia with the patient or authorized representative who has indicated his/her understanding and acceptance.     Dental advisory given  Plan Discussed with: CRNA and Surgeon  Anesthesia Plan Comments:         Anesthesia Quick  Evaluation

## 2020-08-01 NOTE — Anesthesia Postprocedure Evaluation (Signed)
Anesthesia Post Note  Patient: Susan Greene  Procedure(s) Performed: Fistulectomy of Anal (Anus)  Patient location during evaluation: Phase II Anesthesia Type: General Level of consciousness: awake and alert and oriented Pain management: pain level controlled Vital Signs Assessment: post-procedure vital signs reviewed and stable Respiratory status: spontaneous breathing and respiratory function stable Cardiovascular status: blood pressure returned to baseline and stable Postop Assessment: no apparent nausea or vomiting Anesthetic complications: no   No notable events documented.   Last Vitals:  Vitals:   08/01/20 1200 08/01/20 1206  BP: 103/66 107/74  Pulse: 70 (!) 58  Resp: 16 18  Temp:    SpO2: 99% 100%    Last Pain:  Vitals:   08/01/20 1206  TempSrc: Axillary  PainSc: 5                  Micaiah Remillard C Fauna Neuner

## 2020-08-01 NOTE — Progress Notes (Signed)
Spoke with dr Lovell Sheehan- pt had already had toradol in surgery- states to d/c current order. Toradol order was d/c'd

## 2020-08-04 ENCOUNTER — Encounter (HOSPITAL_COMMUNITY): Payer: Self-pay | Admitting: General Surgery

## 2020-08-07 ENCOUNTER — Other Ambulatory Visit: Payer: Self-pay

## 2020-08-07 ENCOUNTER — Encounter: Payer: Self-pay | Admitting: General Surgery

## 2020-08-07 ENCOUNTER — Ambulatory Visit (INDEPENDENT_AMBULATORY_CARE_PROVIDER_SITE_OTHER): Payer: Medicaid Other | Admitting: General Surgery

## 2020-08-07 VITALS — BP 117/77 | HR 80 | Temp 98.5°F | Resp 12 | Ht 65.0 in | Wt 163.0 lb

## 2020-08-07 DIAGNOSIS — Z09 Encounter for follow-up examination after completed treatment for conditions other than malignant neoplasm: Secondary | ICD-10-CM

## 2020-08-07 MED ORDER — HYDROCODONE-ACETAMINOPHEN 5-325 MG PO TABS: 1.0000 | ORAL_TABLET | ORAL | 0 refills | Status: AC | PRN

## 2020-08-07 NOTE — Progress Notes (Signed)
Subjective:     Susan Greene  Here for follow-up, status post fistulectomy.  Patient states she is doing well.  She is still having some rectal pain.  Minimal drainage noted from rectum.  She denies any incontinence. Objective:    BP 117/77   Pulse 80   Temp 98.5 F (36.9 C) (Other (Comment))   Resp 12   Ht 5\' 5"  (1.651 m)   Wt 163 lb (73.9 kg)   LMP 07/29/2020 (Exact Date)   SpO2 98%   BMI 27.12 kg/m   General:  alert, cooperative, and no distress  Rectal incision healing well.  No significant purulent drainage.     Assessment:    Doing well postoperatively.    Plan:   Would start sitz baths.  Instructions given.  I have reordered her hydrocodone for pain.  Follow-up here in 2 weeks.

## 2020-08-21 ENCOUNTER — Encounter: Payer: Medicaid Other | Admitting: General Surgery

## 2020-08-26 ENCOUNTER — Other Ambulatory Visit: Payer: Self-pay

## 2020-08-26 ENCOUNTER — Encounter: Payer: Self-pay | Admitting: General Surgery

## 2020-08-26 ENCOUNTER — Ambulatory Visit (INDEPENDENT_AMBULATORY_CARE_PROVIDER_SITE_OTHER): Payer: Medicaid Other | Admitting: General Surgery

## 2020-08-26 VITALS — BP 137/82 | HR 82 | Temp 98.4°F | Resp 14 | Ht 65.0 in | Wt 162.0 lb

## 2020-08-26 DIAGNOSIS — Z09 Encounter for follow-up examination after completed treatment for conditions other than malignant neoplasm: Secondary | ICD-10-CM

## 2020-08-26 NOTE — Progress Notes (Signed)
Subjective:     Susan Greene  Here for postoperative check.  Patient states she is doing well.  She feels she has fully healed from the surgery.  She has no complaints. Objective:    BP 137/82   Pulse 82   Temp 98.4 F (36.9 C) (Other (Comment))   Resp 14   Ht 5\' 5"  (1.651 m)   Wt 162 lb (73.5 kg)   LMP 07/29/2020 (Exact Date)   SpO2 98%   BMI 26.96 kg/m   General:  alert, cooperative, and no distress       Assessment:    Doing well postoperatively.    Plan:   Follow-up as needed.

## 2020-09-23 ENCOUNTER — Encounter: Payer: Medicaid Other | Admitting: General Surgery

## 2020-09-25 ENCOUNTER — Other Ambulatory Visit: Payer: Self-pay

## 2020-09-25 ENCOUNTER — Encounter: Payer: Self-pay | Admitting: General Surgery

## 2020-09-25 ENCOUNTER — Ambulatory Visit (INDEPENDENT_AMBULATORY_CARE_PROVIDER_SITE_OTHER): Payer: Medicaid Other | Admitting: General Surgery

## 2020-09-25 VITALS — BP 118/74 | HR 70 | Temp 98.4°F | Resp 14 | Ht 65.0 in | Wt 162.0 lb

## 2020-09-25 DIAGNOSIS — Z09 Encounter for follow-up examination after completed treatment for conditions other than malignant neoplasm: Secondary | ICD-10-CM

## 2020-09-26 NOTE — Progress Notes (Signed)
Subjective:     Susan Greene  Patient presents for a wound check.  States she has had pinkish-yellow drainage from her perianal region.  No stool has been present.  She denies any incontinence. Objective:    BP 118/74   Pulse 70   Temp 98.4 F (36.9 C) (Other (Comment))   Resp 14   Ht 5\' 5"  (1.651 m)   Wt 162 lb (73.5 kg)   SpO2 98%   BMI 26.96 kg/m   General:  alert, cooperative, and no distress  Small granulomatous area along the perianal incision line.  No fistulous tract noted.  Silver nitrate applied to the punctate granulomatous wound.     Assessment:    Doing well postoperatively.    Plan:   Patient should keep the wound clean and dry.  Follow-up in 3 weeks for wound check.

## 2020-10-12 ENCOUNTER — Encounter (HOSPITAL_COMMUNITY): Payer: Self-pay

## 2020-10-12 ENCOUNTER — Other Ambulatory Visit: Payer: Self-pay

## 2020-10-12 ENCOUNTER — Emergency Department (HOSPITAL_COMMUNITY)
Admission: EM | Admit: 2020-10-12 | Discharge: 2020-10-12 | Disposition: A | Discharge status: Home / Self Care | Payer: 59 | Attending: Student | Admitting: Student

## 2020-10-12 ENCOUNTER — Emergency Department (HOSPITAL_COMMUNITY): Payer: 59

## 2020-10-12 DIAGNOSIS — F17210 Nicotine dependence, cigarettes, uncomplicated: Secondary | ICD-10-CM | POA: Insufficient documentation

## 2020-10-12 DIAGNOSIS — S46002A Unspecified injury of muscle(s) and tendon(s) of the rotator cuff of left shoulder, initial encounter: Secondary | ICD-10-CM | POA: Diagnosis not present

## 2020-10-12 DIAGNOSIS — Y9289 Other specified places as the place of occurrence of the external cause: Secondary | ICD-10-CM | POA: Insufficient documentation

## 2020-10-12 DIAGNOSIS — W010XXA Fall on same level from slipping, tripping and stumbling without subsequent striking against object, initial encounter: Secondary | ICD-10-CM | POA: Insufficient documentation

## 2020-10-12 DIAGNOSIS — M79602 Pain in left arm: Secondary | ICD-10-CM | POA: Diagnosis not present

## 2020-10-12 DIAGNOSIS — R52 Pain, unspecified: Secondary | ICD-10-CM

## 2020-10-12 DIAGNOSIS — Y9301 Activity, walking, marching and hiking: Secondary | ICD-10-CM | POA: Insufficient documentation

## 2020-10-12 DIAGNOSIS — S4992XA Unspecified injury of left shoulder and upper arm, initial encounter: Secondary | ICD-10-CM | POA: Diagnosis present

## 2020-10-12 LAB — POC URINE PREG, ED: Preg Test, Ur: NEGATIVE

## 2020-10-12 MED ORDER — HYDROCODONE-ACETAMINOPHEN 5-325 MG PO TABS: 1.0000 | ORAL_TABLET | ORAL | 0 refills | Status: DC | PRN

## 2020-10-12 MED ORDER — LIDOCAINE HCL (PF) 2 % IJ SOLN
INTRAMUSCULAR | Status: AC
  Filled 2020-10-12: qty 20

## 2020-10-12 MED ORDER — LIDOCAINE HCL (PF) 2 % IJ SOLN: 10.0000 mL | Freq: Once | INTRAMUSCULAR | Status: DC

## 2020-10-12 MED ORDER — POVIDONE-IODINE 10 % EX SOLN
CUTANEOUS | Status: DC | PRN
  Filled 2020-10-12: qty 15

## 2020-10-12 MED ORDER — IBUPROFEN 600 MG PO TABS: 600.0000 mg | ORAL_TABLET | Freq: Four times a day (QID) | ORAL | 0 refills | Status: DC | PRN

## 2020-10-12 MED ORDER — HYDROCODONE-ACETAMINOPHEN 5-325 MG PO TABS
1.0000 | ORAL_TABLET | Freq: Once | ORAL | Status: AC
  Administered 2020-10-12: 1 via ORAL
  Filled 2020-10-12: qty 1

## 2020-10-12 MED ORDER — KETOROLAC TROMETHAMINE 60 MG/2ML IM SOLN
60.0000 mg | Freq: Once | INTRAMUSCULAR | Status: AC
  Administered 2020-10-12: 60 mg via INTRAMUSCULAR
  Filled 2020-10-12: qty 2

## 2020-10-12 NOTE — ED Triage Notes (Signed)
Pt to er room number 3, pt states that she was walking and slipped, pt states that she hurt her L shoulder, states that she has increased pain with movement and can't lift her arm

## 2020-10-12 NOTE — ED Provider Notes (Signed)
[facility] EMERGENCY DEPARTMENT Provider Note   CSN: [id number] Arrival date & time: 10/12/20  9509     History Chief Complaint  Patient presents with   Arm Pain    Susan Greene is a 35 y.o. female with no significant past medical history presenting for evaluation of left shoulder pain since tripping and falling just prior to arrival.  She was walking outdoors and suspects her crocs caused her to trip and fall, she landed sideways and attempted to catch her fall with her outstretched left arm.  She has localizing pain to the left shoulder joint and has had difficulty raising the arm to shoulder height since the fall.  She denies weakness or numbness in the arm or hand.  She denies head injury or any other complaints of pain associated with today's event.  She has had no treatments prior to arrival.  The history is provided by the patient.      Past Medical History:  Diagnosis Date   Back pain    PONV (postoperative nausea and vomiting)     Patient Active Problem List   Diagnosis Date Noted   Anal fistula     Past Surgical History:  Procedure Laterality Date   INCISION AND DRAINAGE PERIRECTAL ABSCESS  02/25/2012   Procedure: IRRIGATION AND DEBRIDEMENT PERIRECTAL ABSCESS;  Surgeon: Dalia Heading, MD;  Location: [facility];  Service: General;  Laterality: N/A;  Fistulotomy   INCISION AND DRAINAGE PERIRECTAL ABSCESS N/A 08/01/2020   Procedure: Fistulectomy of Anal;  Surgeon: Franky Macho, MD;  Location: [facility];  Service: General;  Laterality: N/A;   KNEE ARTHROSCOPY  Right knee   TUBAL LIGATION       OB History     Gravida  1   Para  1   Term  1   Preterm      AB      Living  1      SAB      IAB      Ectopic      Multiple      Live Births              History reviewed. No pertinent family history.  Social History   Tobacco Use   Smoking status: Every Dominique    Packs/Cabral: 0.50    Types: Cigarettes   Smokeless tobacco: Never   Tobacco comments:     1 to 2 cigarettes a Tiso  Vaping Use   Vaping Use: Never used  Substance Use Topics   Alcohol use: Yes    Alcohol/week: 10.0 standard drinks    Types: 10 Shots of liquor per week    Comment: couple beers on the weekend   Drug use: No    Home Medications Prior to Admission medications   Medication Sig Start Date End Date Taking? Authorizing Provider  HYDROcodone-acetaminophen (NORCO/VICODIN) 5-325 MG tablet Take 1 tablet by mouth every 4 (four) hours as needed. 10/12/20  Yes Brittish Bolinger, Raynelle Fanning, PA-C  ibuprofen (ADVIL) 600 MG tablet Take 1 tablet (600 mg total) by mouth every 6 (six) hours as needed. 10/12/20  Yes IdolRaynelle Fanning, PA-C    Allergies    Patient has no known allergies.  Review of Systems   Review of Systems  Constitutional:  Negative for fever.  Musculoskeletal:  Positive for arthralgias. Negative for joint swelling and myalgias.  Neurological:  Negative for weakness and numbness.  All other systems reviewed and are negative.  Physical Exam Updated Vital Signs BP Marland Kitchen)  146/87 (BP Location: Right Arm)   Pulse (!) 104   Temp 98 F (36.7 C) (Oral)   Resp 18   Ht 5\' 5"  (1.651 m)   Wt 73.5 kg   SpO2 98%   BMI 26.96 kg/m   Physical Exam Constitutional:      Appearance: She is well-developed.  HENT:     Head: Atraumatic.  Cardiovascular:     Pulses:          Radial pulses are 2+ on the right side and 2+ on the left side.     Comments: Pulses equal bilaterally Musculoskeletal:        General: Tenderness present.     Left shoulder: Bony tenderness present. No swelling or deformity. Normal strength.     Cervical back: Normal range of motion.     Comments: Ttp left lateral shoulder joint without deformity.  No crepitus with active or passive ROM, but does not tolerate ROM without increased pain.  Elbow, forearm, wrist and hand non tender.    Skin:    General: Skin is warm and dry.  Neurological:     Mental Status: She is alert.     Sensory: No sensory deficit.     Motor:  No weakness.     Deep Tendon Reflexes: Reflexes normal.    ED Results / Procedures / Treatments   Labs (all labs ordered are listed, but only abnormal results are displayed) Labs Reviewed  POC URINE PREG, ED    EKG None  Radiology DG Shoulder 1V Left  Result Date: 10/12/2020 CLINICAL DATA:  Possible abnormality of the acromion on prior films. Possible dislocation on prior film. Recent fall. EXAM: LEFT SHOULDER COMPARISON:  Radiographs of earlier today at 9:50 a.m. FINDINGS: Single axillary view at 10:55 a.m. demonstrates no dislocation. No acromial abnormality identified. IMPRESSION: No acute osseous abnormality. Electronically Signed   By: 10/14/2020 M.D.   On: 10/12/2020 11:44   DG Shoulder Left  Result Date: 10/12/2020 CLINICAL DATA:  Fall and left shoulder pain. EXAM: LEFT SHOULDER - 2+ VIEW COMPARISON:  None. FINDINGS: Visualized portion of the left hemithorax is normal. The humeral head projects anterior to the central glenoid on the the attempted Y-view, favored to be projectional. On the Y-view, there is equivocal irregularity about the acromion. Otherwise, no fracture or dislocation identified. IMPRESSION: Possible irregularity about the acromion on the Y view. Correlate with point tenderness. If any concern of dislocation, given the anterior position of the humeral head on the scapular Y-view, consider further evaluation with axillary view. Electronically Signed   By: 10/14/2020 M.D.   On: 10/12/2020 10:43    Procedures Procedures   Medications Ordered in ED Medications  ketorolac (TORADOL) injection 60 mg (60 mg Intramuscular Given 10/12/20 1101)  HYDROcodone-acetaminophen (NORCO/VICODIN) 5-325 MG per tablet 1 tablet (1 tablet Oral Given 10/12/20 1152)    ED Course  I have reviewed the triage vital signs and the nursing notes.  Pertinent labs & imaging results that were available during my care of the patient were reviewed by me and considered in my medical decision  making (see chart for details).    MDM Rules/Calculators/A&P                           Pt with fall on outstretched left upper extremity, pain localizing to the left shoulder, imaging negative for fracture or dislocation.  Suspect strain/sprain vs rotator tear.  She was placed  in a sling,  prescribed ibuprofen and hydrocodone for inflammation and sx relief.  Advised other home tx including ice therapy, heat after 24-36 hours - referral to ortho for f/u care.  Final Clinical Impression(s) / ED Diagnoses Final diagnoses:  Left arm pain  Injury of left rotator cuff, initial encounter    Rx / DC Orders ED Discharge Orders          Ordered    HYDROcodone-acetaminophen (NORCO/VICODIN) 5-325 MG tablet  Every 4 hours PRN        10/12/20 1206    ibuprofen (ADVIL) 600 MG tablet  Every 6 hours PRN        10/12/20 1209             Burgess Amor, Cordelia Poche 10/12/20 1620    Kommor, Wyn Forster, MD 10/12/20 [phone]

## 2020-10-12 NOTE — Discharge Instructions (Addendum)
Your x-rays today are negative for fracture or dislocation.  We suspect that you may have torn one of the ligaments in your shoulder which is called a rotator cuff injury.  Please refer to the information about this below.  I recommend taking anti-inflammatories which have been prescribed.  Additionally you may use the hydrocodone if needed for additional pain relief, do not drive within 4 hours of taking this medication as it will make you drowsy.  Call the orthopedics listed for further evaluation and management of your injury.

## 2020-10-16 ENCOUNTER — Ambulatory Visit: Payer: Medicaid Other | Admitting: General Surgery

## 2020-12-15 IMAGING — CT CT HEAD W/O CM
3 series · 16 of 46 positions shown, 19 images · non-contrast
Comparison: None.

CLINICAL DATA: Migraine headache for 1 week, blurred vision and
dizziness

EXAM:
CT HEAD WITHOUT CONTRAST
TECHNIQUE: Contiguous axial images were obtained from the base of the skull
through the vertex without intravenous contrast.

[Series 2: head w o · axial · 0.39mm/px · z∈[+1503,+1623]mm · 10 of 29 slices shown, 13 images]
[im 3/29  brain]
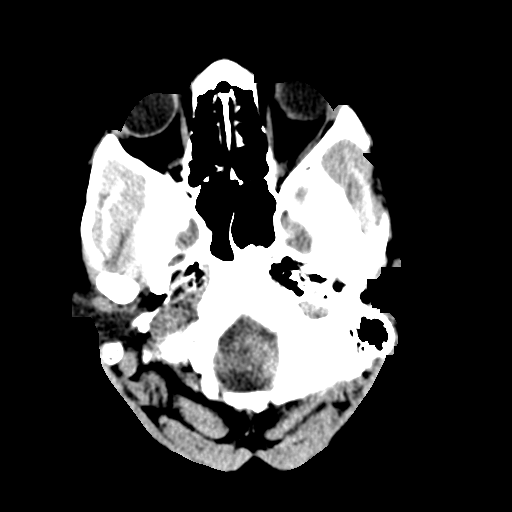
[im 3/29  bone]
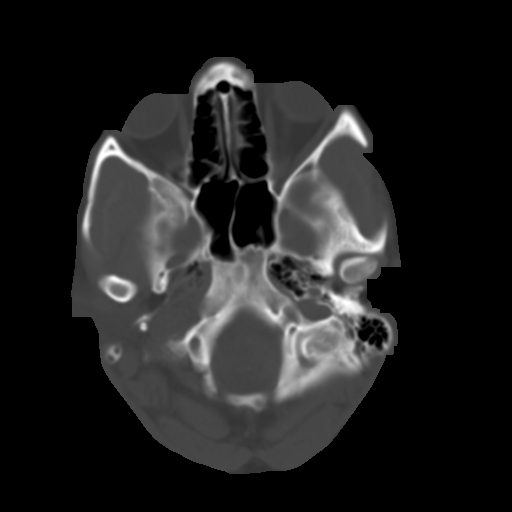
[im 6/29  brain]
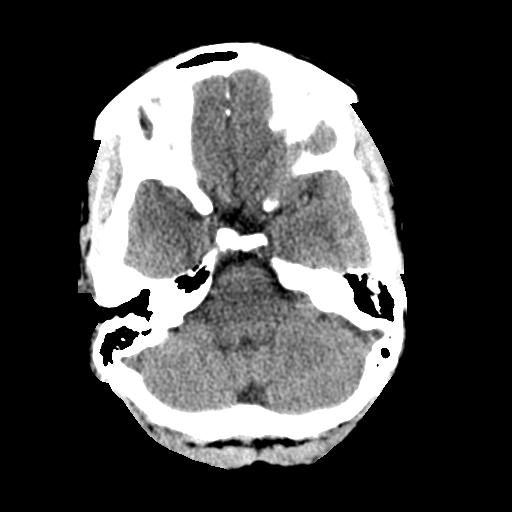
[im 8/29  brain]
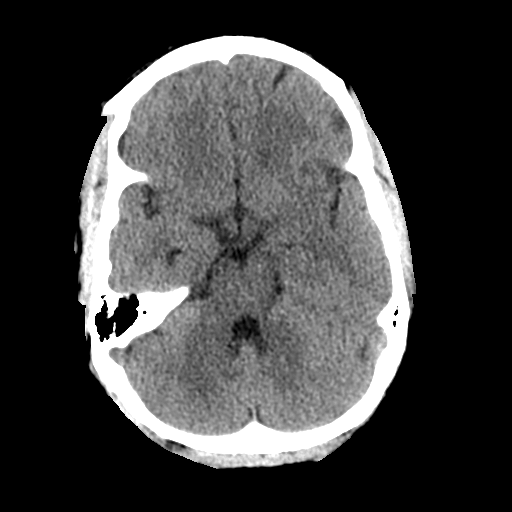
[im 11/29  brain]
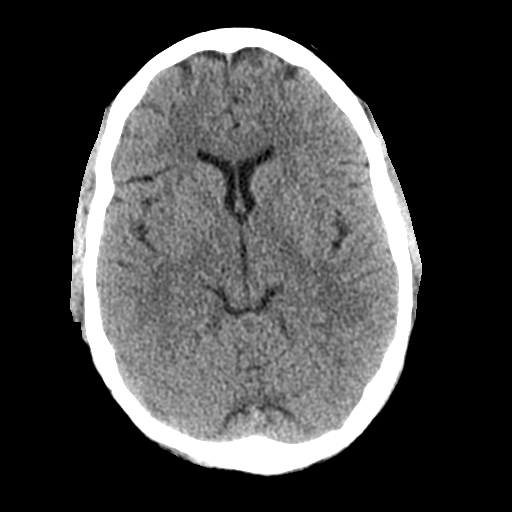
[im 14/29  brain]
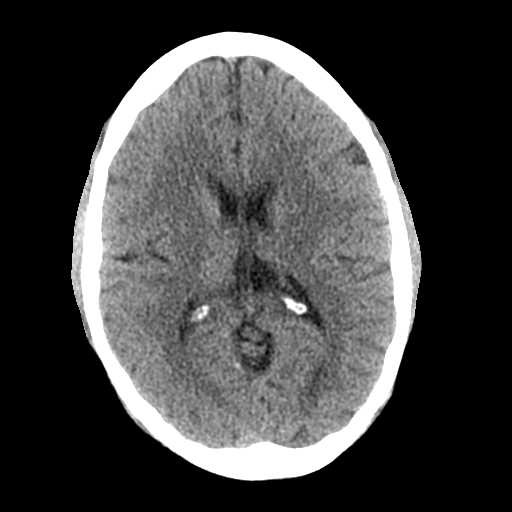
[im 14/29  bone]
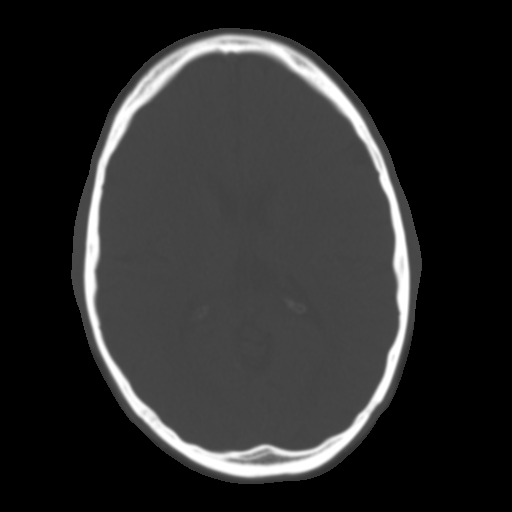
[im 16/29  brain]
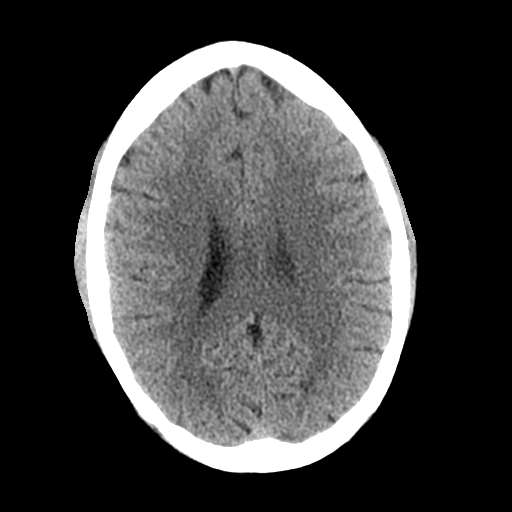
[im 19/29  brain]
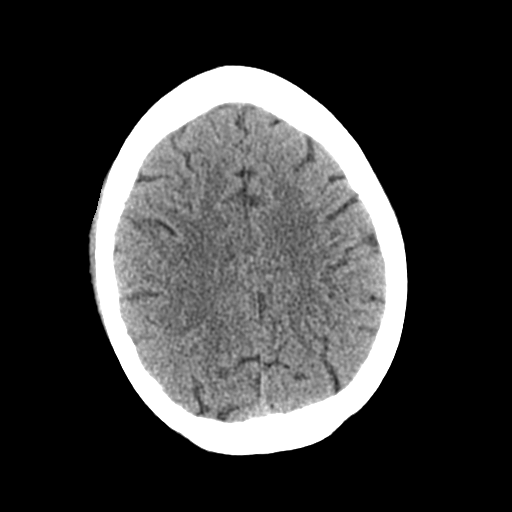
[im 22/29  brain]
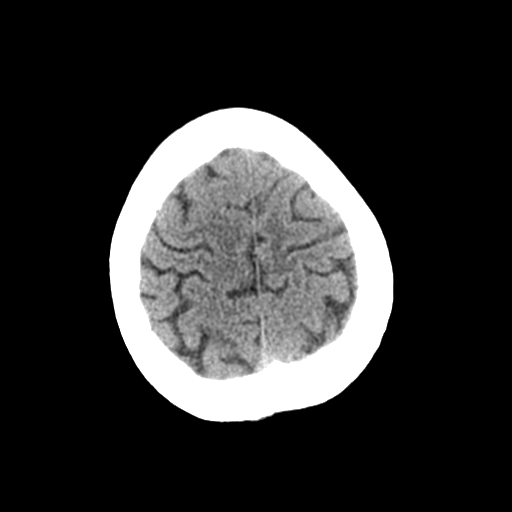
[im 24/29  brain]
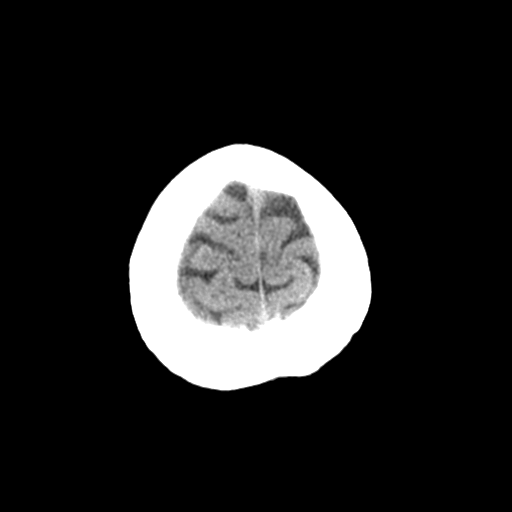
[im 24/29  bone]
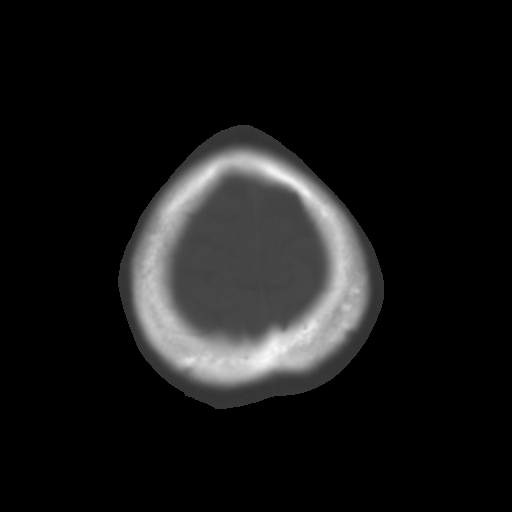
[im 27/29  brain]
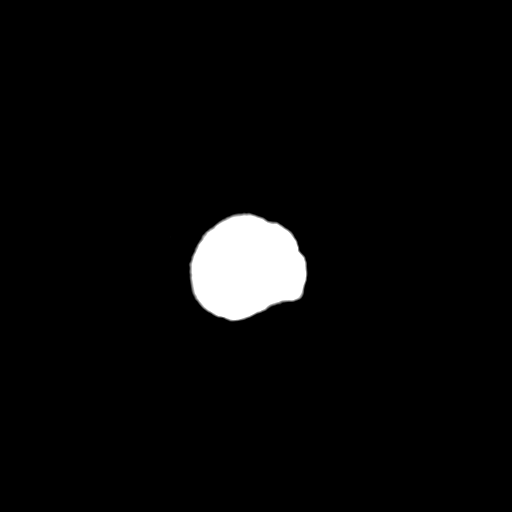

[Series 4: coronal soft · coronal · 0.31mm/px · 3 of 66 slices shown]
[im 22/66  brain]
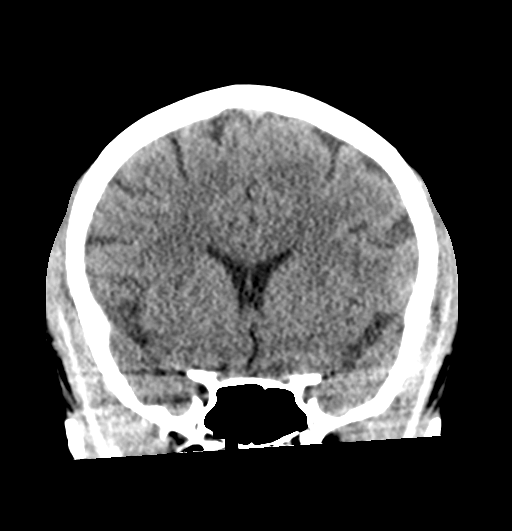
[im 29/66  brain]
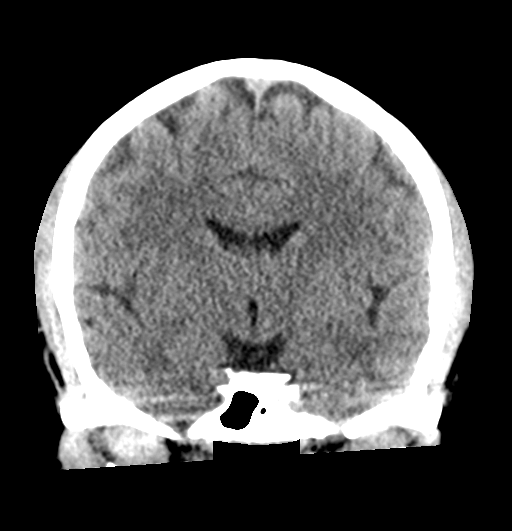
[im 37/66  brain]
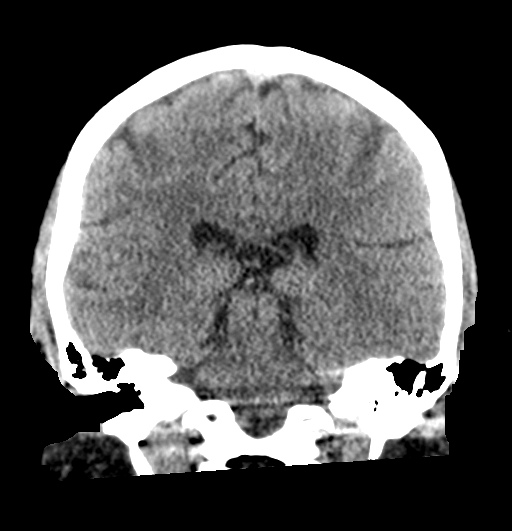

[Series 5: sagittal soft · sagittal · 0.30mm/px · 3 of 54 slices shown]
[im 18/54  brain]
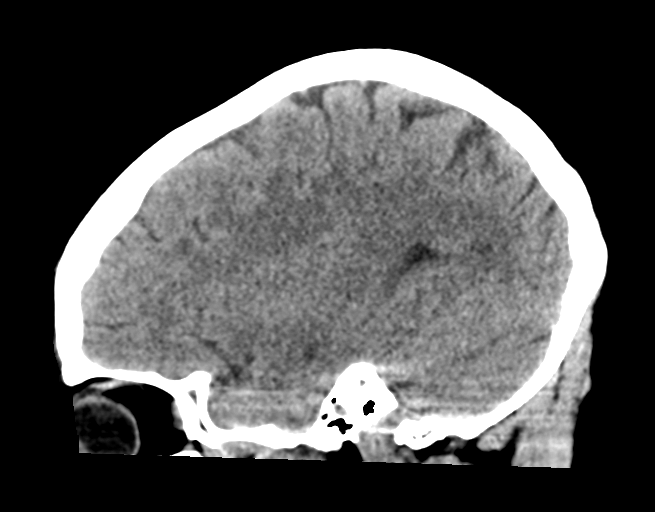
[im 27/54  brain]
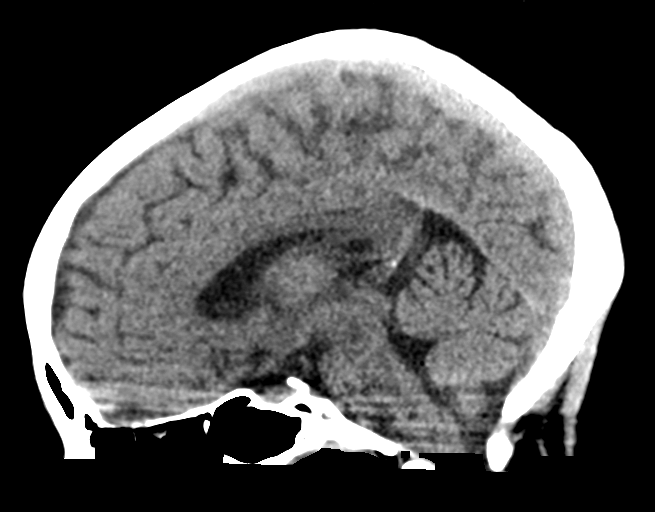
[im 36/54  brain]
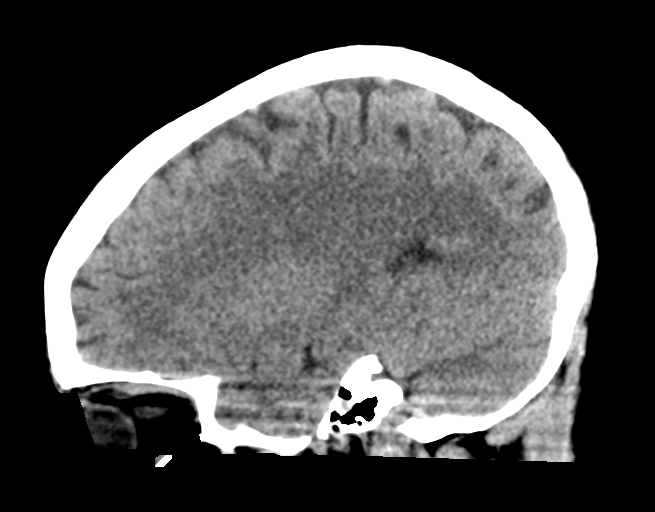

[16 of 46 positions shown; findings below may reference images not displayed]

FINDINGS: Brain: No acute infarct or hemorrhage. Lateral ventricles and
midline structures are unremarkable. No acute extra-axial fluid
collections. No mass effect.

Vascular: No hyperdense vessel or unexpected calcification.

Skull: Normal. Negative for fracture or focal lesion.

Sinuses/Orbits: No acute finding.

Other: None.
IMPRESSION: 1. No acute intracranial process.

## 2021-03-28 ENCOUNTER — Encounter (HOSPITAL_COMMUNITY): Payer: Self-pay | Admitting: *Deleted

## 2021-03-28 ENCOUNTER — Emergency Department (HOSPITAL_COMMUNITY)
Admission: EM | Admit: 2021-03-28 | Discharge: 2021-03-28 | Disposition: A | Discharge status: Home / Self Care | Payer: 59 | Attending: Emergency Medicine | Admitting: Emergency Medicine

## 2021-03-28 DIAGNOSIS — M25512 Pain in left shoulder: Secondary | ICD-10-CM | POA: Diagnosis not present

## 2021-03-28 DIAGNOSIS — R252 Cramp and spasm: Secondary | ICD-10-CM | POA: Insufficient documentation

## 2021-03-28 LAB — CBC WITH DIFFERENTIAL/PLATELET
Abs Immature Granulocytes: 0.02 10*3/uL (ref 0.00–0.07)
Basophils Absolute: 0 10*3/uL (ref 0.0–0.1)
Basophils Relative: 0 %
Eosinophils Absolute: 0.1 10*3/uL (ref 0.0–0.5)
Eosinophils Relative: 2 %
HCT: 39.9 % (ref 36.0–46.0)
Hemoglobin: 13.4 g/dL (ref 12.0–15.0)
Immature Granulocytes: 0 %
Lymphocytes Relative: 38 %
Lymphs Abs: 2.9 10*3/uL (ref 0.7–4.0)
MCH: 28.2 pg (ref 26.0–34.0)
MCHC: 33.6 g/dL (ref 30.0–36.0)
MCV: 83.8 fL (ref 80.0–100.0)
Monocytes Absolute: 0.5 10*3/uL (ref 0.1–1.0)
Monocytes Relative: 7 %
Neutro Abs: 4.1 10*3/uL (ref 1.7–7.7)
Neutrophils Relative %: 53 %
Platelets: 331 10*3/uL (ref 150–400)
RBC: 4.76 MIL/uL (ref 3.87–5.11)
RDW: 15.2 % (ref 11.5–15.5)
WBC: 7.7 10*3/uL (ref 4.0–10.5)
nRBC: 0 % (ref 0.0–0.2)

## 2021-03-28 LAB — COMPREHENSIVE METABOLIC PANEL
ALT: 16 U/L (ref 0–44)
AST: 19 U/L (ref 15–41)
Albumin: 4.7 g/dL (ref 3.5–5.0)
Alkaline Phosphatase: 56 U/L (ref 38–126)
Anion gap: 9 (ref 5–15)
BUN: 5 mg/dL — ABNORMAL LOW (ref 6–20)
CO2: 29 mmol/L (ref 22–32)
Calcium: 9.7 mg/dL (ref 8.9–10.3)
Chloride: 104 mmol/L (ref 98–111)
Creatinine, Ser: 0.64 mg/dL (ref 0.44–1.00)
GFR, Estimated: >60 mL/min (ref >60)
Glucose, Bld: 68 mg/dL — ABNORMAL LOW (ref 70–99)
Potassium: 3.1 mmol/L — ABNORMAL LOW (ref 3.5–5.1)
Sodium: 142 mmol/L (ref 135–145)
Total Bilirubin: 0.2 mg/dL — ABNORMAL LOW (ref 0.3–1.2)
Total Protein: 7.7 g/dL (ref 6.5–8.1)

## 2021-03-28 LAB — MAGNESIUM: Magnesium: 2.3 mg/dL (ref 1.7–2.4)

## 2021-03-28 LAB — CBG MONITORING, ED: Glucose-Capillary: 106 mg/dL — ABNORMAL HIGH (ref 70–99)

## 2021-03-28 LAB — TROPONIN I (HIGH SENSITIVITY): Troponin I (High Sensitivity): 6 ng/L (ref <18)

## 2021-03-28 MED ORDER — POTASSIUM CHLORIDE CRYS ER 20 MEQ PO TBCR
40.0000 meq | EXTENDED_RELEASE_TABLET | Freq: Once | ORAL | Status: AC
  Administered 2021-03-28: 40 meq via ORAL
  Filled 2021-03-28: qty 2

## 2021-03-28 NOTE — ED Triage Notes (Signed)
Larey Seat several months ago and has pain in left shoulder, states she has some issues with her left hand locking up on her today

## 2021-03-28 NOTE — ED Notes (Signed)
Pt given juice at this time for glucose.

## 2021-03-28 NOTE — ED Provider Notes (Signed)
[facility] EMERGENCY DEPARTMENT Provider Note   CSN: [id number] Arrival date & time: 03/28/21  1236     History  No chief complaint on file.   Susan Greene is a 36 y.o. female.  HPI Patient presents for left shoulder pain, left finger muscle spasms, and "funny feeling" on left side of face.  Onset was 3 AM.  At the time, she was driving home from work.  She reports that she had a fall in August where she landed on her left shoulder.  She did have left shoulder pain at that time.  The pain has resolved but returned early this morning.  Along with this pain, she had stiffening in the second and third digits of her left hand.  She also felt a sensation on the left side of her face as if it were drooping.  She reports that she went home but was afraid to go to sleep.  Because of this, she drank alcohol.  She presents today due to concerns of the symptoms she had last night as well as persistent pain in her left shoulder and diminished, although still present, strange feeling in the left side of her face.    Home Medications Prior to Admission medications   Medication Sig Start Date End Date Taking? Authorizing Provider  HYDROcodone-acetaminophen (NORCO/VICODIN) 5-325 MG tablet Take 1 tablet by mouth every 4 (four) hours as needed. 10/12/20   Burgess Amor, PA-C  ibuprofen (ADVIL) 600 MG tablet Take 1 tablet (600 mg total) by mouth every 6 (six) hours as needed. 10/12/20   Burgess Amor, PA-C      Allergies    Patient has no known allergies.    Review of Systems   Review of Systems  Musculoskeletal:  Positive for arthralgias.       Left hand muscle spasms  Neurological:        Sensation of facial droop  All other systems reviewed and are negative.  Physical Exam Updated Vital Signs BP 109/68    Pulse 93    Temp 97.9 F (36.6 C) (Oral)    Resp 16    SpO2 98%  Physical Exam Vitals and nursing note reviewed.  Constitutional:      General: She is not in acute distress.    Appearance:  Normal appearance. She is well-developed and normal weight. She is not ill-appearing, toxic-appearing or diaphoretic.  HENT:     Head: Normocephalic and atraumatic.     Right Ear: External ear normal.     Left Ear: External ear normal.     Nose: Nose normal.     Mouth/Throat:     Mouth: Mucous membranes are moist.     Pharynx: Oropharynx is clear.  Eyes:     General: No visual field deficit or scleral icterus.    Extraocular Movements: Extraocular movements intact.     Conjunctiva/sclera: Conjunctivae normal.  Cardiovascular:     Rate and Rhythm: Normal rate and regular rhythm.     Heart sounds: No murmur heard. Pulmonary:     Effort: Pulmonary effort is normal. No respiratory distress.  Abdominal:     Palpations: Abdomen is soft.     Tenderness: There is no abdominal tenderness.  Musculoskeletal:        General: No swelling or tenderness.     Cervical back: Normal range of motion and neck supple.     Comments: Pain with active shoulder abduction past 90 degrees  Skin:    General: Skin is warm  and dry.     Capillary Refill: Capillary refill takes less than 2 seconds.  Neurological:     Mental Status: She is alert and oriented to person, place, and time.     Cranial Nerves: Cranial nerves 2-12 are intact. No cranial nerve deficit, dysarthria or facial asymmetry.     Sensory: Sensation is intact. No sensory deficit.     Motor: Motor function is intact. No weakness, abnormal muscle tone or pronator drift.     Coordination: Coordination is intact. Finger-Nose-Finger Test normal.  Psychiatric:        Mood and Affect: Mood normal.    ED Results / Procedures / Treatments   Labs (all labs ordered are listed, but only abnormal results are displayed) Labs Reviewed  COMPREHENSIVE METABOLIC PANEL - Abnormal; Notable for the following components:      Result Value   Potassium 3.1 (*)    Glucose, Bld 68 (*)    BUN 5 (*)    Total Bilirubin 0.2 (*)    All other components within  normal limits  CBG MONITORING, ED - Abnormal; Notable for the following components:   Glucose-Capillary 106 (*)    All other components within normal limits  CBC WITH DIFFERENTIAL/PLATELET  MAGNESIUM  TROPONIN I (HIGH SENSITIVITY)    EKG EKG Interpretation  Date/Time:  Saturday March 28 2021 13:14:39 EST Ventricular Rate:  88 PR Interval:  158 QRS Duration: 67 QT Interval:  366 QTC Calculation: 443 R Axis:   47 Text Interpretation: Sinus rhythm Borderline T abnormalities, anterior leads Confirmed by Gloris Manchesterixon, Britaney Espaillat ([id number]) on 03/28/2021 1:21:06 PM  Radiology No results found.  Procedures Procedures    Medications Ordered in ED Medications  potassium chloride SA (KLOR-CON M) CR tablet 40 mEq (40 mEq Oral Given 03/28/21 1418)    ED Course/ Medical Decision Making/ A&P                           Medical Decision Making Amount and/or Complexity of Data Reviewed Labs: ordered.  Risk Prescription drug management.   Healthy 36 year old female presenting for muscle spasm, shoulder pain, and strange feeling in the left side of her face.  On arrival in the ED, she is afebrile.  Vital signs are normal.  On exam, she does have some tenderness throughout the area of left shoulder.  Shoulder pain is worsened with active range of motion, specifically abduction past 90 degrees.  She is unable to maintain full range of motion, although painful.  She denies any injuries to her left shoulder since August.  I do not suspect that she has any fracture or dislocation, given preserved range of motion.  Shoulder is not swollen, erythematous, or warm.  On exam, she has no focal neurologic deficits.  There is no evidence of facial droop.  Patient's muscle spasms may be secondary to the electrolyte abnormalities or dehydration.  Lab work to be obtained.  There is low suspicion for referred pain from a cardiac etiology, given her young age and good underlying health.  Will obtain single troponin to further  rule that out.  EKG shows nonspecific T wave abnormalities.  Troponin is normal.  Patient's lab work is notable for hypokalemia and glucose of 68.  These findings could be related to the symptoms she experienced earlier this morning.  She was given sugar-containing p.o. beverage and replacement potassium.  She had resolution of her symptoms with continued mild shoulder pain.  Given her recent  injuries and good range of motion, I do not feel that any shoulder imaging is indicated.  Patient was advised to return to the ED if she does have new or worsening symptoms.  She is stable for discharge at this time.        Final Clinical Impression(s) / ED Diagnoses Final diagnoses:  Muscle cramps    Rx / DC Orders ED Discharge Orders     None         Gloris Manchester, MD 03/28/21 1729

## 2021-03-28 NOTE — ED Notes (Signed)
Pt verbalized understanding of discharge instructions.

## 2021-11-01 ENCOUNTER — Encounter (HOSPITAL_COMMUNITY): Payer: Self-pay

## 2021-11-01 ENCOUNTER — Other Ambulatory Visit: Payer: Self-pay

## 2021-11-01 ENCOUNTER — Emergency Department (HOSPITAL_COMMUNITY)
Admission: EM | Admit: 2021-11-01 | Discharge: 2021-11-01 | Disposition: A | Discharge status: Home / Self Care | Payer: 59 | Attending: Emergency Medicine | Admitting: Emergency Medicine

## 2021-11-01 DIAGNOSIS — F1490 Cocaine use, unspecified, uncomplicated: Secondary | ICD-10-CM | POA: Diagnosis not present

## 2021-11-01 DIAGNOSIS — F32A Depression, unspecified: Secondary | ICD-10-CM

## 2021-11-01 DIAGNOSIS — F329 Major depressive disorder, single episode, unspecified: Secondary | ICD-10-CM | POA: Insufficient documentation

## 2021-11-01 DIAGNOSIS — F1090 Alcohol use, unspecified, uncomplicated: Secondary | ICD-10-CM | POA: Insufficient documentation

## 2021-11-01 DIAGNOSIS — Z789 Other specified health status: Secondary | ICD-10-CM
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
## 2021-11-01 NOTE — Discharge Instructions (Addendum)
I have provided number of resources for outpatient counseling, substance abuse, as well as information on the [facility] recovery center in [facility].  If you begin to have worsening thoughts of hurting yourself but no active plan, recommend that you reach out to the behavioral urgent care that we discussed.  In the meantime please reach out to the resources that I provided to seek health care and try to establish a counselor, and medication to help with your symptoms as needed.

## 2021-11-01 NOTE — ED Notes (Signed)
Pt left the facility prior to obtaining final vitals and receiving discharge paperwork.

## 2022-04-25 IMAGING — DX DG SHOULDER 1V*L*
1 series · 1 of 1 positions shown · non-contrast
Comparison: Radiographs of earlier today at [DATE] a.m.

CLINICAL DATA: Possible abnormality of the acromion on prior films.
Possible dislocation on prior film. Recent fall.

EXAM:
LEFT SHOULDER

[shoulder ap]
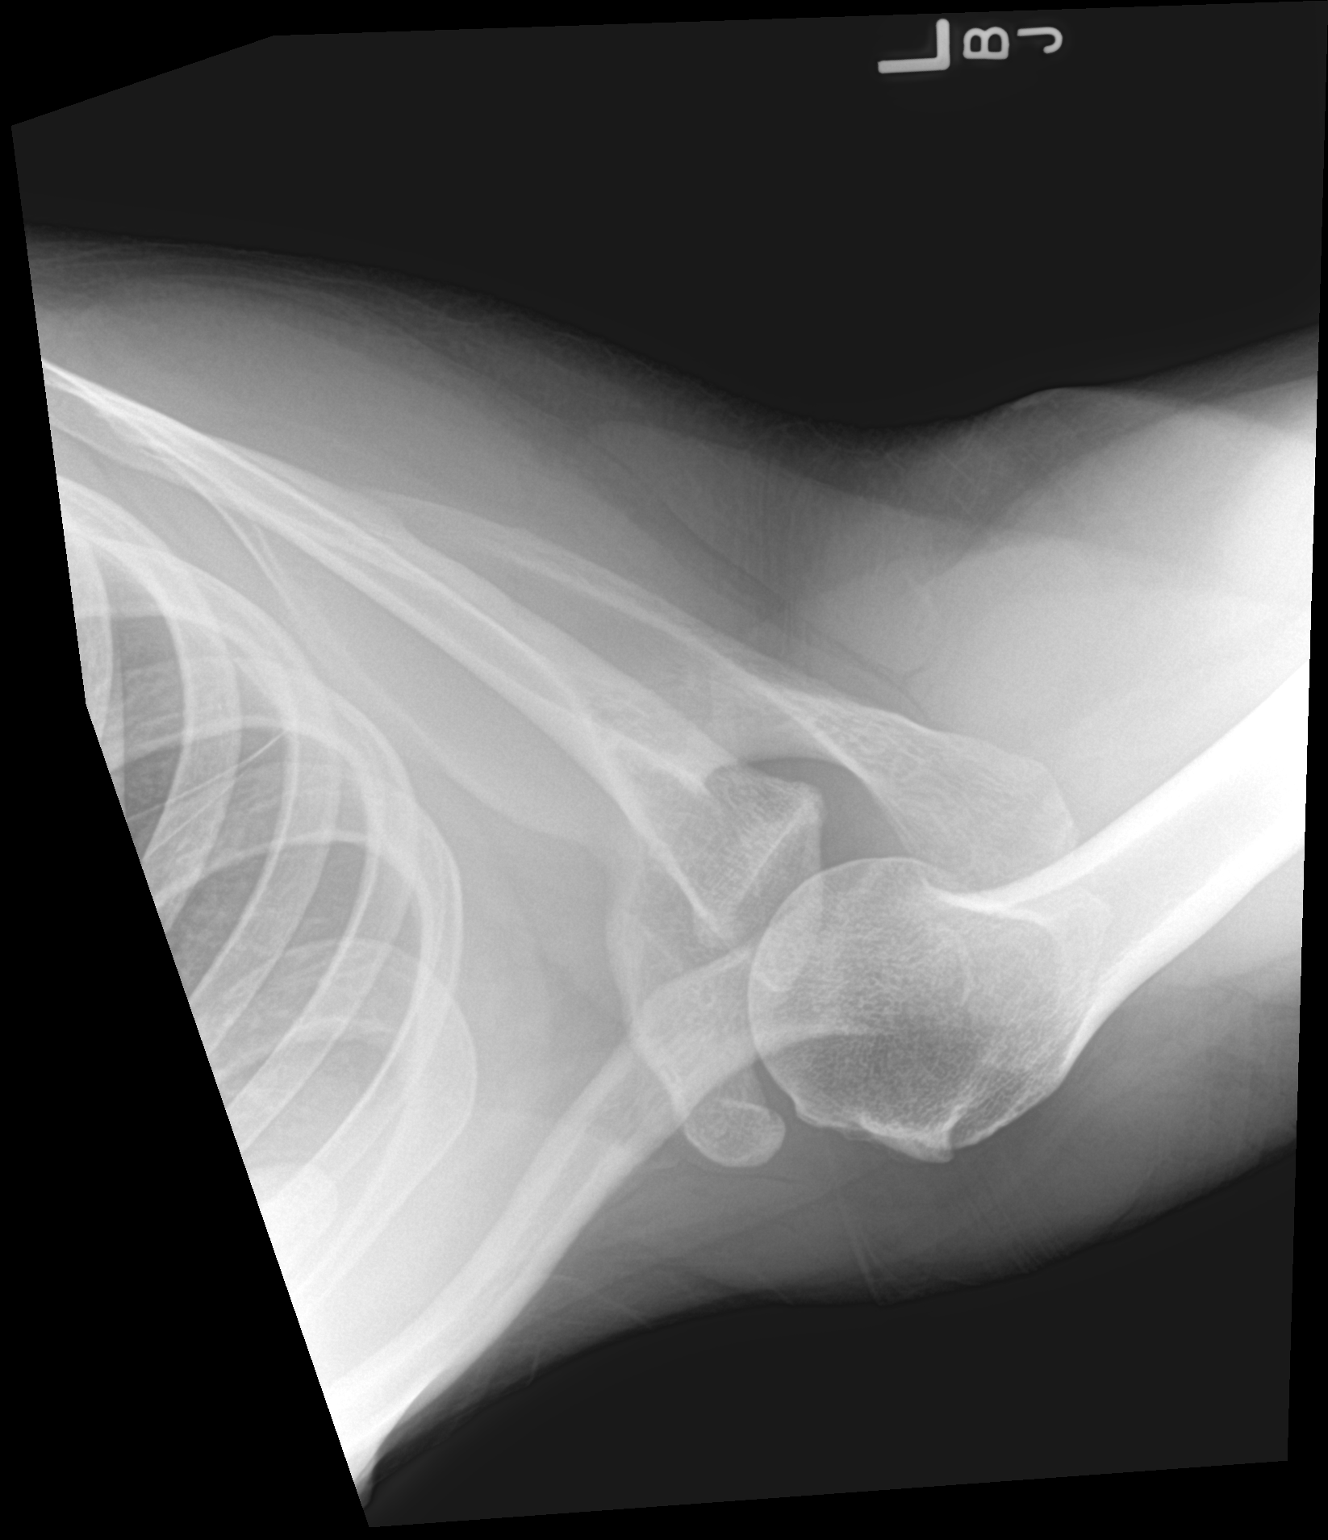

[1 of 1 positions shown; findings below may reference images not displayed]

FINDINGS: Single axillary view at [DATE] a.m. demonstrates no dislocation. No
acromial abnormality identified.
IMPRESSION: No acute osseous abnormality.
